# Patient Record
Sex: Female | Born: 1960 | Race: White | Hispanic: No | Marital: Married | Smoking: Never smoker
Health system: Southern US, Community
[De-identification: ages and names within clinical notes are randomized; demographics above are authoritative.]

## PROBLEM LIST (undated history)

## (undated) DIAGNOSIS — E669 Obesity, unspecified: Secondary | ICD-10-CM

## (undated) DIAGNOSIS — J302 Other seasonal allergic rhinitis: Secondary | ICD-10-CM

## (undated) DIAGNOSIS — C801 Malignant (primary) neoplasm, unspecified: Secondary | ICD-10-CM

## (undated) DIAGNOSIS — Z8542 Personal history of malignant neoplasm of other parts of uterus: Secondary | ICD-10-CM

## (undated) DIAGNOSIS — M1990 Unspecified osteoarthritis, unspecified site: Secondary | ICD-10-CM

## (undated) DIAGNOSIS — I10 Essential (primary) hypertension: Secondary | ICD-10-CM

## (undated) HISTORY — DX: Malignant (primary) neoplasm, unspecified: C80.1

## (undated) HISTORY — DX: Other seasonal allergic rhinitis: J30.2

## (undated) HISTORY — DX: Obesity, unspecified: E66.9

## (undated) HISTORY — DX: Personal history of malignant neoplasm of other parts of uterus: Z85.42

## (undated) HISTORY — DX: Essential (primary) hypertension: I10

## (undated) HISTORY — DX: Unspecified osteoarthritis, unspecified site: M19.90

---

## 1997-10-10 ENCOUNTER — Other Ambulatory Visit: Admission: RE | Admit: 1997-10-10 | Discharge: 1997-10-10 | Payer: Self-pay | Admitting: Obstetrics and Gynecology

## 1999-08-18 ENCOUNTER — Other Ambulatory Visit: Admission: RE | Admit: 1999-08-18 | Discharge: 1999-08-18 | Payer: Self-pay | Admitting: Obstetrics and Gynecology

## 2000-09-13 ENCOUNTER — Other Ambulatory Visit: Admission: RE | Admit: 2000-09-13 | Discharge: 2000-09-13 | Payer: Self-pay | Admitting: Obstetrics and Gynecology

## 2002-11-01 ENCOUNTER — Other Ambulatory Visit: Admission: RE | Admit: 2002-11-01 | Discharge: 2002-11-01 | Payer: Self-pay | Admitting: Obstetrics and Gynecology

## 2005-09-20 ENCOUNTER — Emergency Department (HOSPITAL_COMMUNITY): Admission: EM | Admit: 2005-09-20 | Discharge: 2005-09-20 | Payer: Self-pay | Admitting: Emergency Medicine

## 2008-10-11 ENCOUNTER — Encounter (INDEPENDENT_AMBULATORY_CARE_PROVIDER_SITE_OTHER): Payer: Self-pay | Admitting: Obstetrics and Gynecology

## 2008-10-11 ENCOUNTER — Ambulatory Visit (HOSPITAL_COMMUNITY): Admission: RE | Admit: 2008-10-11 | Discharge: 2008-10-11 | Payer: Self-pay | Admitting: Obstetrics and Gynecology

## 2008-11-16 HISTORY — PX: TOTAL ABDOMINAL HYSTERECTOMY: SHX209

## 2010-07-19 HISTORY — PX: TOTAL KNEE ARTHROPLASTY: SHX125

## 2010-10-29 LAB — COMPREHENSIVE METABOLIC PANEL
ALT: 17 U/L (ref 0–35)
BUN: 10 mg/dL (ref 6–23)
CO2: 29 mEq/L (ref 19–32)
Calcium: 8.6 mg/dL (ref 8.4–10.5)
Creatinine, Ser: 0.77 mg/dL (ref 0.4–1.2)
Sodium: 132 mEq/L — ABNORMAL LOW (ref 135–145)
Total Protein: 6.7 g/dL (ref 6.0–8.3)

## 2010-10-29 LAB — CBC
Hemoglobin: 10.3 g/dL — ABNORMAL LOW (ref 12.0–15.0)
Platelets: 410 10*3/uL — ABNORMAL HIGH (ref 150–400)
RBC: 4.03 MIL/uL (ref 3.87–5.11)
RDW: 14.6 % (ref 11.5–15.5)
WBC: 8.3 10*3/uL (ref 4.0–10.5)

## 2010-12-01 NOTE — Op Note (Signed)
NAMEGINGER, LEETH            ACCOUNT NO.:  [id number]   MEDICAL RECORD NO.:  [id number]          PATIENT TYPE:  AMB   LOCATION:  SDC                           FACILITY:  [facility]   PHYSICIAN:  Duke Salvia. Marcelle Overlie, M.D.DATE OF BIRTH:  [birth year]   DATE OF PROCEDURE:  DATE OF DISCHARGE:                               OPERATIVE REPORT   PREOPERATIVE DIAGNOSES:  1. Abnormal uterine bleeding.  2. Endometrial polyp.  3. Possible adenomyosis.   POSTOPERATIVE DIAGNOSES:  1. Abnormal uterine bleeding.  2. Endometrial polyp.  3. Possible adenomyosis.   PROCEDURE:  D and C and hysteroscopy.   SURGEON:  Duke Salvia. Marcelle Overlie, MD   ANESTHESIA:  General.   COMPLICATIONS:  None.   DRAINS:  In-and-out Foley catheter.   BLOOD LOSS:  Minimal.   SPECIMENS REMOVED:  Endometrial curettings.   PROCEDURE AND FINDINGS:  The patient was taken to the operating room.  After an adequate level of general anesthesia was obtained with the  patient's legs in stirrups, the perineum and vagina were prepped and  draped with Betadine.  The bladder was drained.  EUA carried out.  Uterus midposition, normal size.  Adnexa negative.  A rectocele was  noted with first-degree uterine prolapse.  Anterior support was good.   Weighted speculum was positioned.  Cervix grasped with a tenaculum.  Paracervical block created by infiltrating at 3 and 9 o'clock  submucosally 5-7 mL of 1% Xylocaine on either side after negative  aspiration.  The uterus was then sounded to 8 cm, progressively dilated  to a 27 Pratt dilator.  The 5-mm continuous flow hysteroscope was then  used perform hysteroscopy revealing a left fundal polyp and other  generalized endometrial buildup.  Sharp curettage and  exploration with a polyp forceps was then carried out.  Tissue submitted  to Pathology.  The scope was reinserted, and the cavity was irrigated  and noted to be clean.  Both tubal ostia were unremarkable.  No other  tissue or debris  was noted.  She tolerated this well, went to recovery  room in good condition.      Richard M. Marcelle Overlie, M.D.  Electronically Signed     RMH/MEDQ  D:  10/11/2008  T:  10/11/2008  Job:  [id number]

## 2010-12-01 NOTE — H&P (Signed)
NAMEFAREEDAH, Linda Hawkins            ACCOUNT NO.:  [id number]   MEDICAL RECORD NO.:  [id number]          PATIENT TYPE:  AMB   LOCATION:  SDC                           FACILITY:  [facility]   PHYSICIAN:  Duke Salvia. Marcelle Overlie, M.D.DATE OF BIRTH:  Nov 13, 1960   DATE OF ADMISSION:  DATE OF DISCHARGE:                              HISTORY & PHYSICAL   CHIEF COMPLAINT:  Abnormal uterine bleeding.   HISTORY OF PRESENT ILLNESS:  A 50 year old G3, P3, prior tubal, with a 6-  12 months history of heavy often irregular bleeding.  FHT was done in  our office, March 2010 that showed slight thickening of the myometrium,  possible adenomyosis, simple cyst on the left ovary.  Right ovary  negative.  There was a well-defined 1.3-cm polyp in the endometrial  cavity.  She presents now for D and C, hysteroscopy.  This procedure  including risks related to bleeding, infection, adjacent organ injury,  and other complications that may require additional surgery all  reviewed, which she understands and accepts.   PAST MEDICAL HISTORY:   ALLERGIES:  None.   CURRENT MEDICATIONS:  Diovan.   OBSTETRICAL HISTORY:  Three vaginal deliveries.   SOCIAL HISTORY:  Denies alcohol or tobacco use.  She is married.  Her  PCP is Dr. Georgina Pillion.   REVIEW OF SYSTEMS:  Significant for essential hypertension and arthritis  in her knee.   FAMILY HISTORY:  Significant for diabetes, VTE, hypertension, heart  disease, and IBS   PHYSICAL EXAMINATION:  GENERAL:  Temperature 98.2 and blood pressure  130/82.  LUNGS:  Clear.  HEENT:  Unremarkable.  NECK:  Supple without masses.  CARDIOVASCULAR:  Rate and rhythm without murmurs, rubs, or gallops  noted.  BREASTS:  Negative.  ABDOMEN:  Soft, flat, and nontender.  PELVIC:  Vulva, vagina, are cervix normal.  Pap February 2010 normal.  Uterus midposition, normal size.  Adnexa negative.  EXTREMITIES:  Unremarkable.  NEUROLOGIC:  Unremarkable.   IMPRESSION:  Abnormal uterine bleeding,  endometrial polyp.   PLAN:  D and C hysteroscopy.  Procedure and risks reviewed as above up.      Richard M. Marcelle Overlie, M.D.  Electronically Signed     RMH/MEDQ  D:  10/04/2008  T:  10/05/2008  Job:  [id number]

## 2011-02-17 HISTORY — PX: JOINT REPLACEMENT: SHX530

## 2011-03-03 ENCOUNTER — Other Ambulatory Visit: Payer: Self-pay | Admitting: Orthopedic Surgery

## 2011-03-03 ENCOUNTER — Ambulatory Visit (HOSPITAL_COMMUNITY)
Admission: RE | Admit: 2011-03-03 | Discharge: 2011-03-03 | Disposition: A | Discharge status: Home / Self Care | Payer: 59 | Source: Ambulatory Visit | Attending: Orthopedic Surgery | Admitting: Orthopedic Surgery

## 2011-03-03 ENCOUNTER — Encounter (HOSPITAL_COMMUNITY): Payer: 59

## 2011-03-03 ENCOUNTER — Other Ambulatory Visit (HOSPITAL_COMMUNITY): Payer: Self-pay | Admitting: Orthopedic Surgery

## 2011-03-03 DIAGNOSIS — Z79899 Other long term (current) drug therapy: Secondary | ICD-10-CM | POA: Insufficient documentation

## 2011-03-03 DIAGNOSIS — Z01818 Encounter for other preprocedural examination: Secondary | ICD-10-CM

## 2011-03-03 DIAGNOSIS — M1710 Unilateral primary osteoarthritis, unspecified knee: Secondary | ICD-10-CM | POA: Insufficient documentation

## 2011-03-03 DIAGNOSIS — Z01812 Encounter for preprocedural laboratory examination: Secondary | ICD-10-CM | POA: Insufficient documentation

## 2011-03-03 DIAGNOSIS — Z8542 Personal history of malignant neoplasm of other parts of uterus: Secondary | ICD-10-CM | POA: Insufficient documentation

## 2011-03-03 DIAGNOSIS — I10 Essential (primary) hypertension: Secondary | ICD-10-CM | POA: Insufficient documentation

## 2011-03-03 DIAGNOSIS — Z90710 Acquired absence of both cervix and uterus: Secondary | ICD-10-CM | POA: Insufficient documentation

## 2011-03-03 DIAGNOSIS — K219 Gastro-esophageal reflux disease without esophagitis: Secondary | ICD-10-CM | POA: Insufficient documentation

## 2011-03-03 LAB — URINALYSIS, ROUTINE W REFLEX MICROSCOPIC
Bilirubin Urine: NEGATIVE
Hgb urine dipstick: NEGATIVE
Ketones, ur: NEGATIVE mg/dL
Nitrite: NEGATIVE

## 2011-03-03 LAB — SURGICAL PCR SCREEN: Staphylococcus aureus: NEGATIVE

## 2011-03-03 LAB — BASIC METABOLIC PANEL
BUN: 14 mg/dL (ref 6–23)
CO2: 34 mEq/L — ABNORMAL HIGH (ref 19–32)
Calcium: 9.8 mg/dL (ref 8.4–10.5)
Chloride: 101 mEq/L (ref 96–112)
GFR calc Af Amer: >60 mL/min (ref >60)
Glucose, Bld: 77 mg/dL (ref 70–99)
Potassium: 3.9 mEq/L (ref 3.5–5.1)

## 2011-03-03 LAB — DIFFERENTIAL
Eosinophils Absolute: 0.2 10*3/uL (ref 0.0–0.7)
Eosinophils Relative: 2 % (ref 0–5)
Neutrophils Relative %: 75 % (ref 43–77)

## 2011-03-03 LAB — CBC
HCT: 41.6 % (ref 36.0–46.0)
Hemoglobin: 13.4 g/dL (ref 12.0–15.0)
Platelets: 331 10*3/uL (ref 150–400)
RDW: 13.7 % (ref 11.5–15.5)
WBC: 10.3 10*3/uL (ref 4.0–10.5)

## 2011-03-03 LAB — PROTIME-INR: Prothrombin Time: 13.9 seconds (ref 11.6–15.2)

## 2011-03-08 ENCOUNTER — Inpatient Hospital Stay (HOSPITAL_COMMUNITY)
Admission: RE | Admit: 2011-03-08 | Discharge: 2011-03-10 | DRG: 470 | Disposition: A | Discharge status: Home Health | Payer: 59 | Source: Ambulatory Visit | Attending: Orthopedic Surgery | Admitting: Orthopedic Surgery

## 2011-03-08 DIAGNOSIS — Z01812 Encounter for preprocedural laboratory examination: Secondary | ICD-10-CM

## 2011-03-08 DIAGNOSIS — M21069 Valgus deformity, not elsewhere classified, unspecified knee: Secondary | ICD-10-CM | POA: Diagnosis present

## 2011-03-08 DIAGNOSIS — M1710 Unilateral primary osteoarthritis, unspecified knee: Principal | ICD-10-CM | POA: Diagnosis present

## 2011-03-08 DIAGNOSIS — K219 Gastro-esophageal reflux disease without esophagitis: Secondary | ICD-10-CM | POA: Diagnosis present

## 2011-03-08 DIAGNOSIS — Z79899 Other long term (current) drug therapy: Secondary | ICD-10-CM

## 2011-03-08 DIAGNOSIS — I10 Essential (primary) hypertension: Secondary | ICD-10-CM | POA: Diagnosis present

## 2011-03-08 LAB — TYPE AND SCREEN

## 2011-03-09 LAB — BASIC METABOLIC PANEL
CO2: 29 mEq/L (ref 19–32)
Calcium: 8.7 mg/dL (ref 8.4–10.5)
Chloride: 99 mEq/L (ref 96–112)
Creatinine, Ser: 0.63 mg/dL (ref 0.50–1.10)
GFR calc non Af Amer: >60 mL/min (ref >60)
Glucose, Bld: 121 mg/dL — ABNORMAL HIGH (ref 70–99)

## 2011-03-10 LAB — CBC
HCT: 34.6 % — ABNORMAL LOW (ref 36.0–46.0)
Hemoglobin: 11 g/dL — ABNORMAL LOW (ref 12.0–15.0)
MCV: 86.1 fL (ref 78.0–100.0)
RDW: 14 % (ref 11.5–15.5)
WBC: 11.6 10*3/uL — ABNORMAL HIGH (ref 4.0–10.5)

## 2011-03-10 LAB — BASIC METABOLIC PANEL
Chloride: 100 mEq/L (ref 96–112)
GFR calc non Af Amer: >60 mL/min (ref >60)
Potassium: 3.7 mEq/L (ref 3.5–5.1)
Sodium: 137 mEq/L (ref 135–145)

## 2011-03-12 NOTE — Discharge Summary (Signed)
NAMEGILLIAN, Hawkins NO.:  [id number]  MEDICAL RECORD NO.:  [id number]  LOCATION:  1613                         FACILITY:  [facility]  PHYSICIAN:  Madlyn Frankel. Charlann Boxer, M.D.  DATE OF BIRTH:  [birth year]  DATE OF ADMISSION:  03/08/2011 DATE OF DISCHARGE:  03/10/2011                              DISCHARGE SUMMARY   PROCEDURE:  Left total knee arthroplasty.  ADMITTING DIAGNOSIS:  Left knee osteoarthritis.  DISCHARGE DIAGNOSES: 1. Status post left total knee arthroplasty. 2. Hypertension. 3. Reflux.  HISTORY OF PRESENT ILLNESS:  The patient is a 50 year old lady with a history of osteoarthritis of her left knee.  The patient has tried several conservative treatments all of which have failed to control her symptoms.  X-rays in the clinic do reveal osteoarthritis of the left knee.  After discussions of options with the patient, the patient wished to proceed with surgery.  Risks, benefits and expectations of procedure were discussed with the patient.  The patient understands risks, benefits and expectations and wishes to proceed with a left total knee arthroplasty per Dr. Charlann Boxer.  HOSPITAL COURSE:  The patient underwent the above-stated procedure on March 08, 2011.  The patient tolerated the procedure well, was brought to the recovery room in good condition and subsequently to the floor.  Postop day #1, the patient was doing well, no instances, afebrile, vital signs stable.  She is distally neurovascularly intact.  Hemoglobin and hematocrit are 11.8/36.56, otherwise good.  The patient's Hemovac was removed.  Dressing was good, clear, dry and intact.  The patient has physical therapy.  Postop day #2, March 10, 2011, the patient doing well this morning.  On the morning, she is afebrile, vital signs stable.  Hematocrit is 34.6. Left knee is clean, dry and intact.  The patient does have physical therapy.  She is felt to be doing well enough to be discharged  home.  DISCHARGE CONDITION:  Good.  DISCHARGE INSTRUCTIONS:  The patient will be discharged home today, March 10, 2011 with home health.  The patient will be weightbearing as tolerated.  The patient will maintain her surgical dressing for about 8 days after which she will replace with gauze and tape.  The patient will keep the area dry and clean until follow-up.  The patient will follow up with Dr. Charlann Boxer, [facility] in 2 weeks.  DISCHARGE MEDICATIONS: 1. Aspirin enteric-coated 325 mg one p.o. b.i.d. x4 weeks. 2. Benadryl 25 mg one p.o. q.4 h. p.r.n. 3. Colace 100 mg one p.o. b.i.d. p.r.n. constipation. 4. Iron sulfate 325 mg one p.o. t.i.d. times 2 to 3 weeks. 5. Norco 7.5/325 one to two p.o. q.4-6 hours p.r.n. pain. 6. MiraLax 17 g one p.o. q. day p.r.n. constipation. 7. Robaxin 500 mg one p.o. q.6 h. p.r.n. muscle spasms. 8. Diovan HCT 160/25 mg one p.o. q.a.m. 9. Zantac 150 mg one p.o. b.i.d.    ______________________________ Lanney Gins, PA   ______________________________ Madlyn Frankel. Charlann Boxer, M.D.    MB/MEDQ  D:  03/10/2011  T:  03/10/2011  Job:  [id number]  cc:   Ancil Boozer, MD Fax: [phone]  Electronically Signed by Lanney Gins PA on 03/11/2011 12:56:58 PM Electronically Signed by Durene Romans  M.D. on 03/12/2011 07:57:43 AM

## 2011-03-12 NOTE — Op Note (Signed)
NAMEBRISEIDY, SPARK NO.:  [id number]  MEDICAL RECORD NO.:  [id number]  LOCATION:  1613                         FACILITY:  [facility]  PHYSICIAN:  Madlyn Frankel. Charlann Boxer, M.D.  DATE OF BIRTH:  [birth year]  DATE OF PROCEDURE:  03/08/2011 DATE OF DISCHARGE:                              OPERATIVE REPORT   PREOPERATIVE DIAGNOSIS:  Left knee osteoarthritis.  POSTOPERATIVE DIAGNOSIS:  Left knee osteoarthritis.  PROCEDURE:  Left total knee replacement utilizing DePuy component size 2.5 posterior stabilized rotating platform knee with a size 3 tibia, 12.5 insert, and a 35 patellar button.  SURGEON:  Madlyn Frankel. Charlann Boxer, MD  ASSISTANT:  Lanney Gins, PA-C  ANESTHESIA:  General plus a spinal block that may or may not have applied.  SPECIMENS:  None.  COMPLICATIONS:  None.  BLOOD LOSS:  Minimal.  DRAINS:  One Hemovac.  TOURNIQUET TIME:  38 minutes at 250 mmHg.  INDICATIONS OF PROCEDURE:  Linda Hawkins is a pleasant, 50 year old female currently referred for surgical consultation of her left knee. Radiographs revealed advanced left knee arthritis with bone on bone changes, predominant lateral and anterior compartments.  She had failed conservative measures and after reviewing risks of infection, DVT, component failure, need for revision surgery, particularly related to her overall size, and other comorbidities.  Consent was obtained for benefit of pain relief for left total knee replacement.  PROCEDURE IN DETAIL:  The patient was brought to operative theater. Once adequate anesthesia, preoperative antibiotics, Ancef administered, she was positioned supine with left thigh tourniquet placed.  The left lower extremity was then prepped and draped in sterile fashion with left foot placed in [facility] leg holder.  Time-out was performed identifying the patient, planned procedure, and extremity.  A midline incision was made followed by median arthrotomy.  Limited exposure on  the proximal medial tibia was carried out based on the valgus nature of knee.  Following the exposure and debridement, attention was directed to the patella.  Precut measurement was noted to be about 21 mm, I resected down to just 14 mm, used a 35 patellar button to restore height.  Lug holes were drilled and a metal shim placed to protect the patella from retractors and saw blades.  At this point, the attention was directed to femur.  Femoral canal was opened with a drill, irrigated to try to prevent fat emboli.  An intramedullary rod was passed and at 5 degrees of valgus 10 mm of bone resected off the distal femur.  Following this resection, attention was directed to tibia and the tibia was subluxated anteriorly.  By cruciate debridement, a resection of 6 mm based off the proximal lateral tibia was carried out.  I then confirmed the gap would be stable with the medial and lateral collateral ligaments intact with a 10 mm insert.  We also confirmed the cut was perpendicular in the coronal plane using alignment rod.  I sized the femur to be a size 2.5, the size 2.5 rotation block was then pinned into position, anterior reference using a C-clamp off the proximal tibial cut.  A 4:1 cutting block was then pinned into position.  Anterior and posterior chamfer cuts made without difficulty nor notching.  Final box cut was made off the lateral aspect of distal femur.  At this point, the tibia was subluxated anteriorly, this tibial size was determined to be a size 3 tibial tray.  It was pinned into position to the medial third of tubercle, drilled, and keel punched.  Trial reduction was carried out 2.5 femur, 3 tibia, and a 12.5 insert was chosen initially.  The knee came to full extension with stable from extension and flexion.  There was a little bit of a lateral pull on the patella, however, I felt the rotation, size, and position of components was adequate.  I felt this was more soft  tissue base.  At this point, the trial components were removed from the knee.  The final components were opened and cement mixed.  The synovial capsule junction of the knee was injected with 0.25% Marcaine with epinephrine and 1 cc of Toradol, total 61 cc.  The knee was then irrigated with normal saline solution.  Final components were cemented onto clean and dried cut surfaces of bone.  The knee was brought to extension with a 12.5 insert and extruded cement was removed. Once the cement had fully cured and excessive cement was removed throughout the knee, the final insert was chosen.  The knee was re-irrigated, the tourniquet had been let down after 37 minutes without significant hemostasis required.  Medium Hemovac drain was placed deep.  The extensor mechanism was then reapproximated with the knee in flexion using #1 Vicryl.  The remainder of the wound was closed with 2-0 Vicryl and running 4-0 Monocryl.  The knee was cleaned, dried, and dressed sterilely using Dermabond and Aquacel dressing. Drain site dressed separately.  She was then brought to recovery room in stable condition tolerating procedure well.     Madlyn Frankel Charlann Boxer, M.D.     MDO/MEDQ  D:  03/08/2011  T:  03/09/2011  Job:  [id number]  Electronically Signed by Durene Romans M.D. on 03/12/2011 07:57:39 AM

## 2011-03-15 NOTE — H&P (Addendum)
  Linda Hawkins, Linda Hawkins            ACCOUNT NO.:  [id number]  MEDICAL RECORD NO.:  [id number]  LOCATION:  PADM                         FACILITY:  [facility]  PHYSICIAN:  Madlyn Frankel. Charlann Boxer, M.D.  DATE OF BIRTH:  August 22, 1960  DATE OF ADMISSION:  03/03/2011 DATE OF DISCHARGE:                             HISTORY & PHYSICAL   DATE OF SURGERY:  March 08, 2011  ADMISSION DIAGNOSIS:  Left knee osteoarthritis.  HISTORY OF PRESENT ILLNESS:  This is a 50 year old lady with history of osteoarthritis of the left knee with failure of conserve treatment to alleviate her symptoms.  After discussion of treatment, benefits, risks and options, the patient now scheduled for total knee arthroplasty of left knee.  Note that she is a candidate for tranexamic acid and will receive that at the time of surgery.  She is given her home medicines of aspirin, iron, Robaxin, MiraLax and Colace.  Her medical doctor is Dr. Ancil Boozer at [place].  She will be going home after surgery.  PAST MEDICAL HISTORY:  Drug allergies to LISINOPRIL with cough.  CURRENT MEDICATIONS:  Diovan and Zantac.  MEDICAL ILLNESSES:  Hypertension and reflux and previous surgeries, hysterectomy.  Also history of uterine cancer.  For the hysterectomy, the patient did not need chemo or radiation and has had no problems with it since.  FAMILY HISTORY:  Positive for lung disease, hypertension, and cancer.  SOCIAL HISTORY:  The patient is married.  She is a Scientist, water quality.  She does not smoke and does not drink.  She will be going home after surgery.  REVIEW OF SYSTEMS:  CENTRAL NERVOUS SYSTEM:  Negative for blurred vision or dizziness.  PULMONARY:  Negative for shortness breath, PND, or orthopnea.  CARDIOVASCULAR:  Negative for chest pain, palpitation.  GI: Negative for ulcers, hepatitis.  GU:  Negative for urinary tract difficulty.  MUSCULOSKELETAL:  Positive in HPI.  PHYSICAL EXAMINATION:  VITAL SIGNS:  Blood pressure 128/84,  respirations 16, pulse 78 and regular. GENERAL APPEARANCE:  She is a well-developed, well-nourished lady in no acute distress. HEENT: Head normocephalic.  Nose patent.  Ears patent.  Pupils equal, round, reactive to light.  Throat without injection. NECK:  Supple without adenopathy.  Carotids 2+ without bruit. CHEST:  Clear to auscultation.  No rales or rhonchi.  Respirations 16. HEART:  Regular rate and rhythm at 78 beats per minute without murmur. ABDOMEN:  Soft with active bowel sounds.  No masses or organomegaly. NEUROLOGIC:  The patient is alert and oriented to time, place, person. Cranial nerves II-XII grossly intact. EXTREMITIES:  Show the left knee with a valgus deformity, 3 degree reflex traction, further flexion to 125 degrees.  Neurovascular status intact.  IMPRESSION:  Left knee osteoarthritis  plans left total knee arthroplasty     Jaquelyn Bitter. Chabon, P.A.   ______________________________ Madlyn Frankel Charlann Boxer, M.D.    SJC/MEDQ  D:  03/03/2011  T:  03/04/2011  Job:  [id number]  Electronically Signed by Jodene Nam P.A. on 03/14/2011 08:48:29 AM Electronically Signed by Durene Romans M.D. on 03/15/2011 07:42:53 AM

## 2011-05-26 ENCOUNTER — Other Ambulatory Visit: Payer: Self-pay | Admitting: Family Medicine

## 2011-05-26 DIAGNOSIS — E079 Disorder of thyroid, unspecified: Secondary | ICD-10-CM

## 2011-06-15 ENCOUNTER — Ambulatory Visit
Admission: RE | Admit: 2011-06-15 | Discharge: 2011-06-15 | Disposition: A | Discharge status: Home / Self Care | Payer: 59 | Source: Ambulatory Visit | Attending: Family Medicine | Admitting: Family Medicine

## 2011-06-15 DIAGNOSIS — E079 Disorder of thyroid, unspecified: Secondary | ICD-10-CM

## 2011-06-19 HISTORY — PX: BIOPSY THYROID: PRO38

## 2011-06-30 ENCOUNTER — Encounter (INDEPENDENT_AMBULATORY_CARE_PROVIDER_SITE_OTHER): Payer: Self-pay | Admitting: Surgery

## 2011-07-02 ENCOUNTER — Ambulatory Visit (INDEPENDENT_AMBULATORY_CARE_PROVIDER_SITE_OTHER): Payer: 59 | Admitting: Surgery

## 2011-07-02 ENCOUNTER — Encounter (INDEPENDENT_AMBULATORY_CARE_PROVIDER_SITE_OTHER): Payer: Self-pay | Admitting: Surgery

## 2011-07-02 VITALS — BP 136/98 | HR 64 | Temp 97.4°F | Resp 18 | Ht 63.0 in | Wt 271.1 lb

## 2011-07-02 DIAGNOSIS — E079 Disorder of thyroid, unspecified: Secondary | ICD-10-CM

## 2011-07-02 NOTE — Patient Instructions (Signed)
We will arrange a biopsy of the two areas in your thyroid gland. It is obese are benign and appeared to be consistent with a goiter he may not need to have surgery. We will discuss this after the results of the biopsy are available.

## 2011-07-02 NOTE — Progress Notes (Signed)
Referred by Dr Ancil Boozer, MD  CC: Multinodular goiter HPI: On a recent routine physical examination this patient was noticed to have an enlarged thyroid gland. She is having no symptoms from it such as compression was change Melvern Banker she has had some fatigue. She was not aware of a mass in her neck and has had no other neck problems in the past.   ROS: Her 12 point review of systems filled out on our office for his negative  MEDS: Current Outpatient Prescriptions  Medication Sig Dispense Refill  . ranitidine (ZANTAC) 150 MG capsule Take 150 mg by mouth 2 (two) times daily.        . valsartan-hydrochlorothiazide (DIOVAN-HCT) 160-25 MG per tablet Take 1 tablet by mouth daily.            ALLERGIES: Allergies  Allergen Reactions  . Lisinopril Cough      PE Gen.: The patient is alert oriented and healthy-appearing Neck: Thyroid gland is visible. It is diffusely enlarged and nodular. There are no hard masses. It is nontender. The left side seems larger than the right. There is no associated cervical adenopathy.  Data Reviewed I have reviewed the notes from her primary physician. Thyroid function tests were normal. I also reviewed the ultrasound report which shows multiple solid and complex cystic masses within both lobes of the thyroid gland. Two of these were somewhat suspicious looking. No adenopathy was noted.  Assessment Multinodular goiter with two nodules were somewhat suspicious  Plan I think the next step should be a fine-needle aspiration biopsy of both nodules in the thyroid gland that were suspicious. If these are benign and consistent with multinodular goiter she may well be able to be managed medically with thyroid suppression. Since she is not having any current symptoms I think this would be a reasonable alternative to surgery. I did explain thyroid surgery to her including the fact that she would likely need a total thyroidectomy in have risks for recurrent  nerve injury and hypocalcemia.  CC; Dr Sandi Mealy

## 2011-07-28 ENCOUNTER — Ambulatory Visit
Admission: RE | Admit: 2011-07-28 | Discharge: 2011-07-28 | Disposition: A | Discharge status: Home / Self Care | Payer: 59 | Source: Ambulatory Visit | Attending: Surgery | Admitting: Surgery

## 2011-07-28 ENCOUNTER — Other Ambulatory Visit (HOSPITAL_COMMUNITY)
Admission: RE | Admit: 2011-07-28 | Discharge: 2011-07-28 | Disposition: A | Discharge status: Home / Self Care | Payer: 59 | Source: Ambulatory Visit | Attending: Interventional Radiology | Admitting: Interventional Radiology

## 2011-07-28 DIAGNOSIS — E079 Disorder of thyroid, unspecified: Secondary | ICD-10-CM

## 2011-07-28 DIAGNOSIS — E049 Nontoxic goiter, unspecified: Secondary | ICD-10-CM | POA: Insufficient documentation

## 2011-08-04 ENCOUNTER — Encounter (INDEPENDENT_AMBULATORY_CARE_PROVIDER_SITE_OTHER): Payer: Self-pay

## 2011-08-06 ENCOUNTER — Telehealth (INDEPENDENT_AMBULATORY_CARE_PROVIDER_SITE_OTHER): Payer: Self-pay

## 2011-08-06 NOTE — Telephone Encounter (Signed)
Returned pt's v.m. Explaining that Lesly Rubenstein was not in the office today but I would give this message to the so she can call with thyroid results on Monday.

## 2011-08-09 NOTE — Telephone Encounter (Signed)
Made her aware her thyroid results look benign. Advised for her to make an appt for follow up with Dr Jamey Ripa to discuss results further and discuss a plan. Patient agrees with this and made appt for 08/20/11 with Dr Jamey Ripa.

## 2011-08-20 ENCOUNTER — Encounter (INDEPENDENT_AMBULATORY_CARE_PROVIDER_SITE_OTHER): Payer: 59 | Admitting: Surgery

## 2011-09-09 ENCOUNTER — Ambulatory Visit (INDEPENDENT_AMBULATORY_CARE_PROVIDER_SITE_OTHER): Payer: 59 | Admitting: Surgery

## 2011-09-09 ENCOUNTER — Encounter (INDEPENDENT_AMBULATORY_CARE_PROVIDER_SITE_OTHER): Payer: Self-pay | Admitting: Surgery

## 2011-09-09 VITALS — BP 142/94 | HR 84 | Temp 98.2°F | Resp 16 | Ht 64.0 in | Wt 275.8 lb

## 2011-09-09 DIAGNOSIS — E042 Nontoxic multinodular goiter: Secondary | ICD-10-CM

## 2011-09-09 NOTE — Patient Instructions (Signed)
We will need to see you again in six months and repeat your thyroid ultrasound and blood test her thyroid functions prior to your visit with me. If he developed any symptoms from your thyroid such as compression, problem swallowing et Karie Soda give me a call and we will see you sooner

## 2011-09-09 NOTE — Progress Notes (Signed)
  CC: Followup thyroid nodule  HPI: This patient comes in for post biopsy follow-up. She underwent bilateral thyroid nodule biopsies on July 28, 2011. She feels that she is doing well. She did not have any significant change in her thyroid symptoms which are basically minimal with only some compression symptoms noted right after the biopsy. PE: General: The patient appears to be healthy, NAD Her thyroid gland continued to be marked enlarged with about a 4 cm nodule in the left lobe and a 2-1/2 cm nodule in the right lobe  DATA REVIEWED: Biopsy of both lobes showed findings consistent with a benign follicular nodule with some cystic degeneration on the one on the left side.   IMPRESSION: The patient is doing well S/P thyroid biopsy. However alternatives with her which would include thyroidectomy and we discussed the risks and complications of that surgery. In addition we talked about just doing a six-month follow up with an ultrasound and thyroid function to that point in time. Since there were no suspicious findings on ultrasound and this looks more consistent with a multinodular goiter in terms of the exam and ultrasound exam and she is asymptomatic I think a six-month followup is reasonable alternative.    PLAN: I will see her back in six months as noted above. One option for her might be to go on some thyroid suppression with Synthroid. I told her she could discuss that with her primary physician but otherwise we will just plan to see her back for follow up here

## 2012-01-18 ENCOUNTER — Ambulatory Visit
Admission: RE | Admit: 2012-01-18 | Discharge: 2012-01-18 | Disposition: A | Discharge status: Home / Self Care | Payer: 59 | Source: Ambulatory Visit | Attending: Surgery | Admitting: Surgery

## 2012-01-18 ENCOUNTER — Telehealth (INDEPENDENT_AMBULATORY_CARE_PROVIDER_SITE_OTHER): Payer: Self-pay | Admitting: General Surgery

## 2012-01-18 DIAGNOSIS — E042 Nontoxic multinodular goiter: Secondary | ICD-10-CM

## 2012-01-18 NOTE — Telephone Encounter (Signed)
Patient made aware of ultrasound results. Will follow up at appt and call with any questions prior.

## 2012-01-18 NOTE — Telephone Encounter (Signed)
Message copied by Liliana Cline on Tue Jan 18, 2012 12:05 PM ------      Message from: Currie Paris      Created: Tue Jan 18, 2012 11:53 AM       Let her know that the ultrasound shows stable nodules, no worrisome change

## 2012-03-24 ENCOUNTER — Encounter (INDEPENDENT_AMBULATORY_CARE_PROVIDER_SITE_OTHER): Payer: 59 | Admitting: Surgery

## 2013-01-14 IMAGING — US US SOFT TISSUE HEAD/NECK
1 series · 14 of 25 positions shown · non-contrast
Comparison: None.

CLINICAL DATA: Thyroidmegaly

THYROID ULTRASOUND
TECHNIQUE: Ultrasound examination of the thyroid gland and adjacent
soft tissues was performed.

[Series 1: us soft tissue head/neck · 0.13mm/px · 14 of 78 slices shown]
[im 1/78]
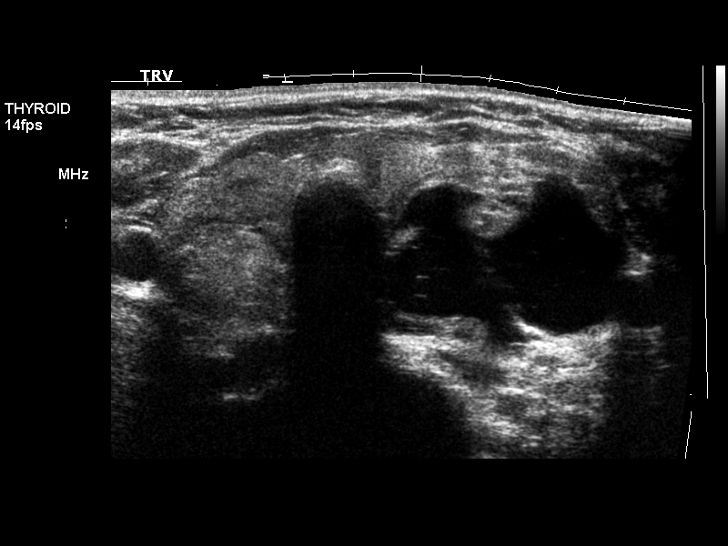
[im 7/78]
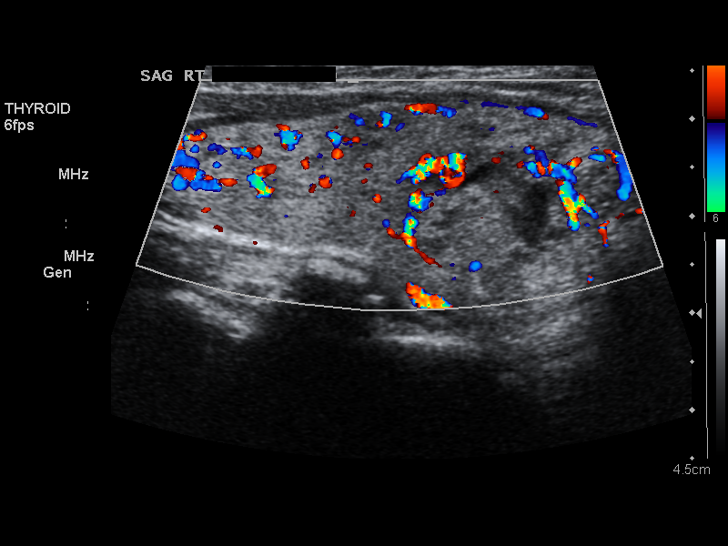
[im 13/78]
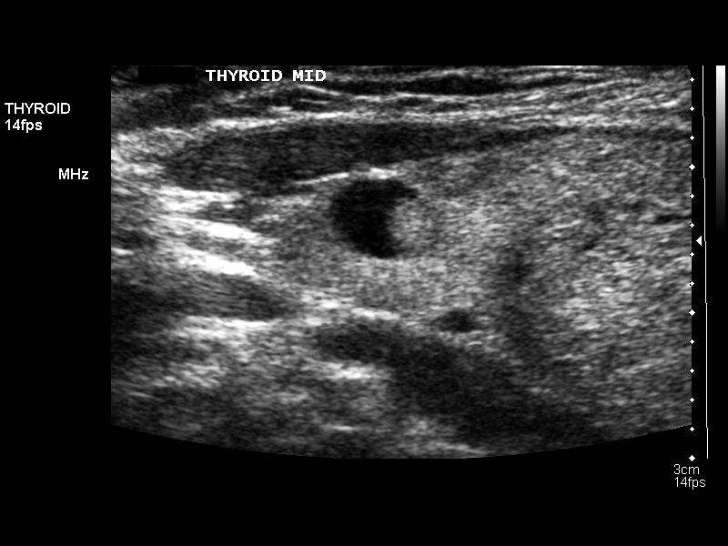
[im 20/78]
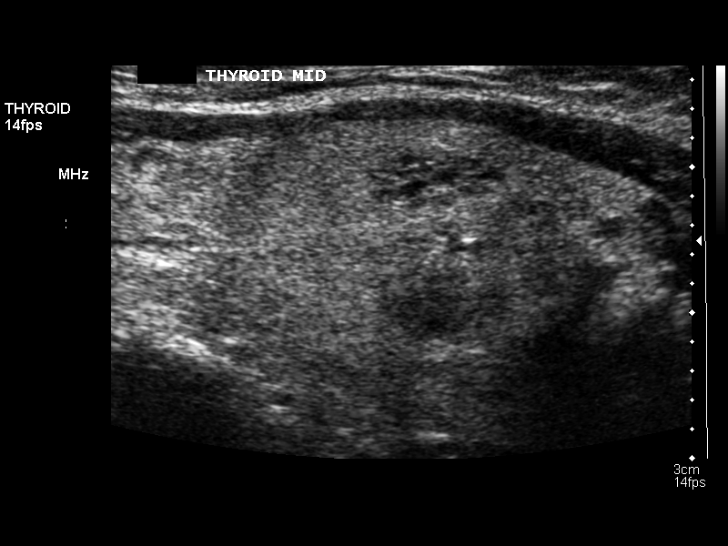
[im 26/78]
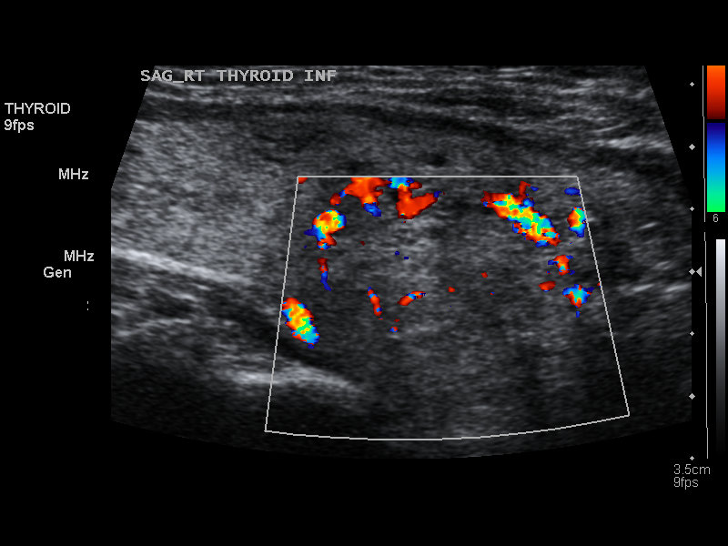
[im 29/78]
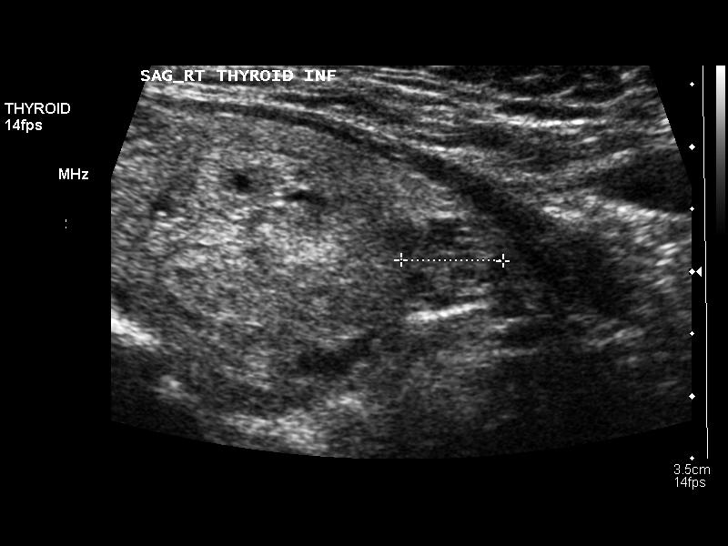
[im 36/78]
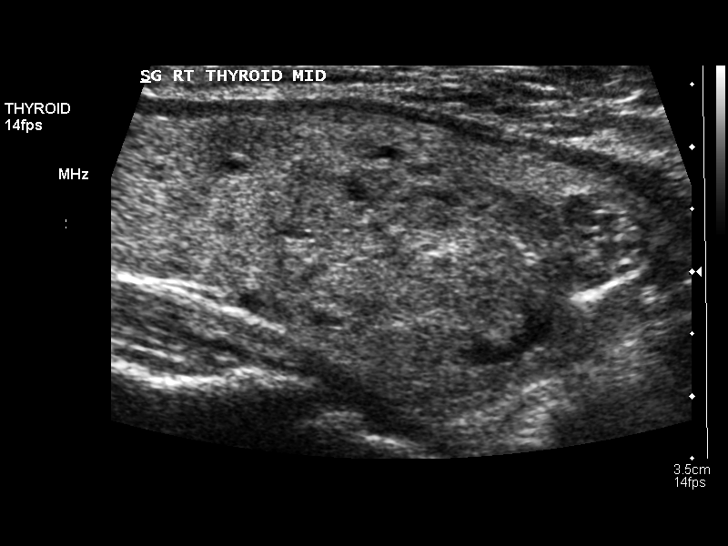
[im 42/78]
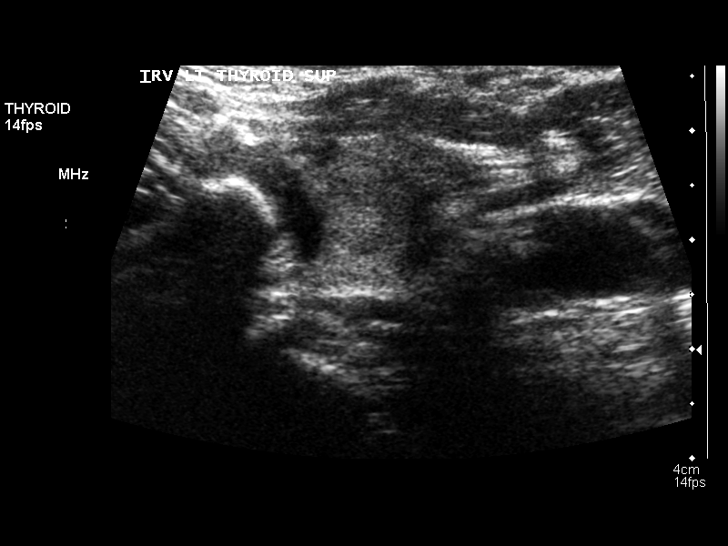
[im 49/78]
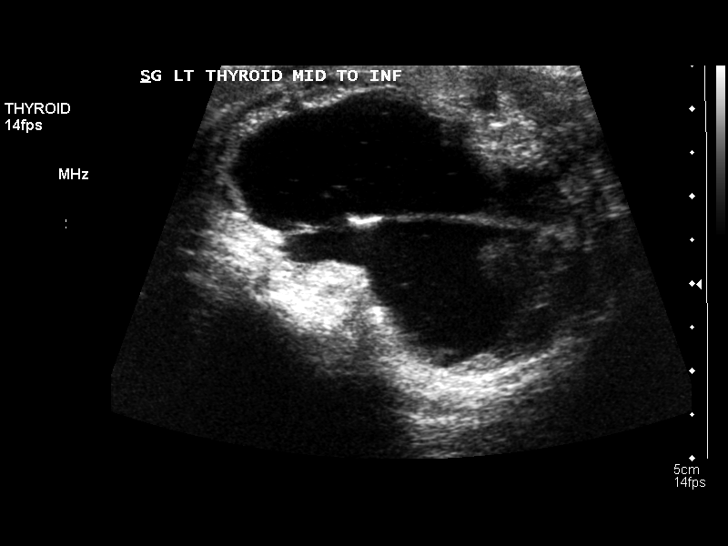
[im 52/78]
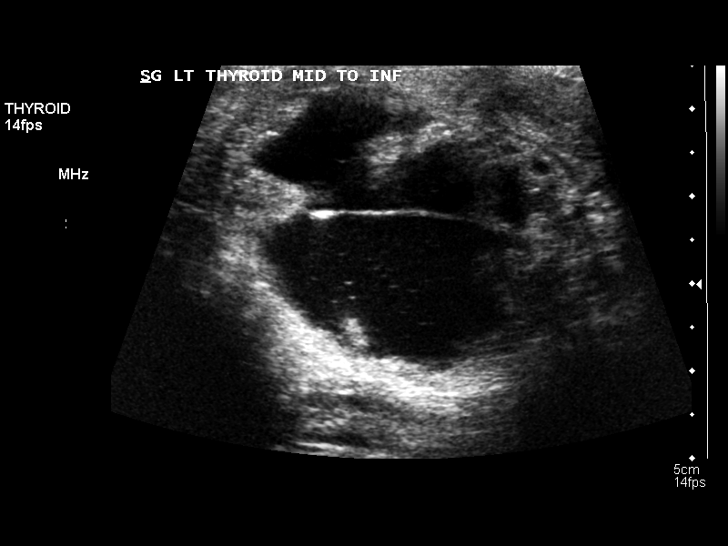
[im 58/78]
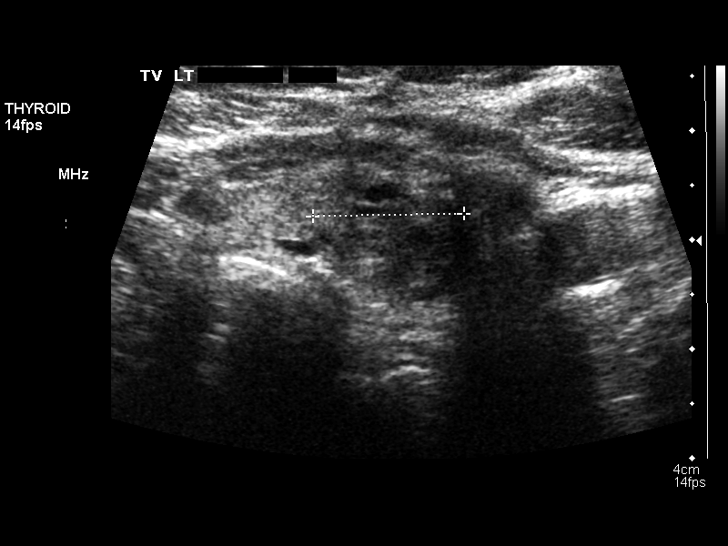
[im 65/78]
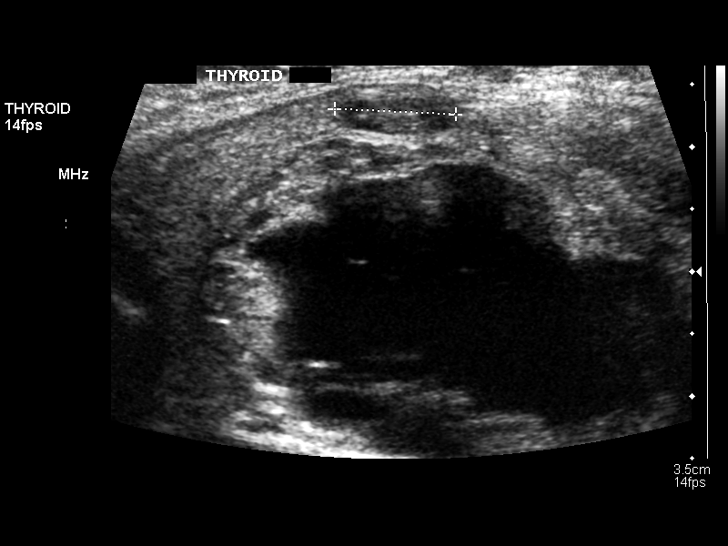
[im 71/78]
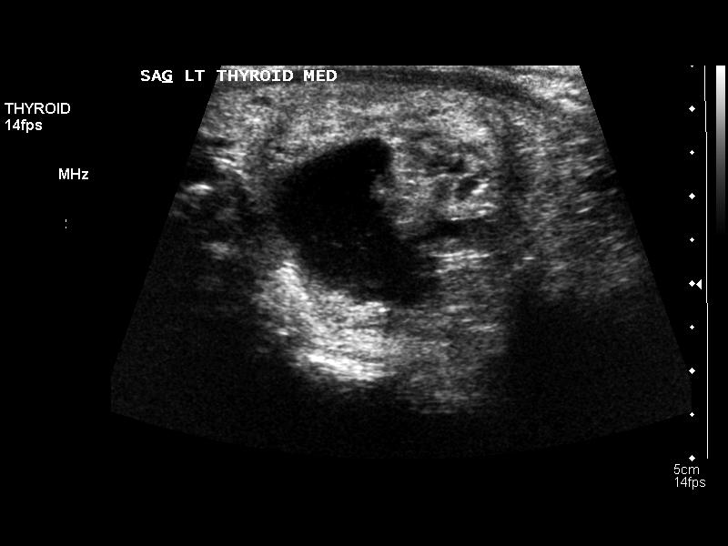
[im 78/78]
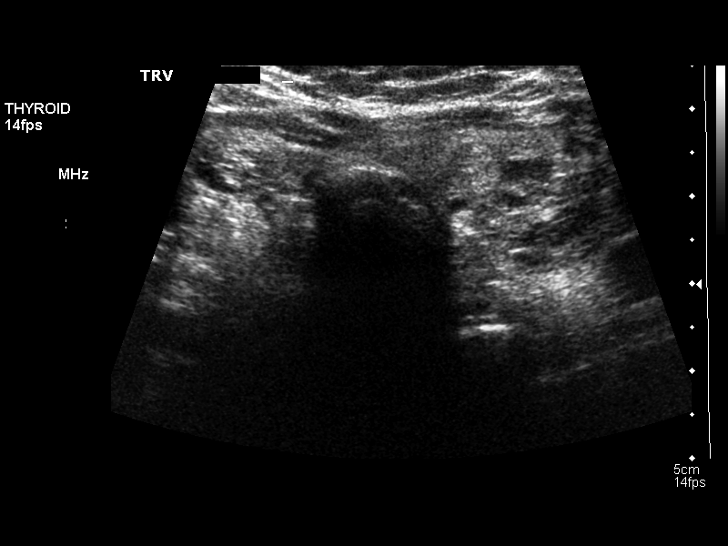

[14 of 25 positions shown; findings below may reference images not displayed]

FINDINGS: Both lobes of the thyroid gland are enlarged and
heterogeneous with the right lobe measuring 5.6 x 2.7 x 2.2 cm, and
the left lobe measuring 7.4 x 3.7 x 3.8 cm.  The isthmus is also
thickened, measuring 6 mm in AP width.

There are multiple solid and complex cystic masses within both
lobes of the thyroid gland.  The largest are a 2.4 cm irregular
solid mass in the inferior pole of the right lobe and a 4.7 cm
complex cystic mass with irregular mural nodularity in the mid -
inferior pole of the left lobe.  There is an additional 1.4 cm
solid mass at the inferior pole of the left lobe.  The remainder of
the lesions bilaterally are less than 1 cm.

There is no adenopathy.
IMPRESSION: Findings are consistent with a multinodular goiter.

There are suspicious dominant lesions within both lobes for which
biopsy should be considered.  These are located in the inferior
pole of the right lobe and the mid - inferior pole of the left
lobe, as described above.

## 2019-02-06 ENCOUNTER — Other Ambulatory Visit: Payer: Self-pay

## 2019-02-06 DIAGNOSIS — Z20822 Contact with and (suspected) exposure to covid-19: Secondary | ICD-10-CM

## 2019-02-09 LAB — NOVEL CORONAVIRUS, NAA: SARS-CoV-2, NAA: NOT DETECTED

## 2019-10-13 ENCOUNTER — Ambulatory Visit: Payer: Self-pay | Attending: Internal Medicine

## 2019-10-13 DIAGNOSIS — Z23 Encounter for immunization: Secondary | ICD-10-CM

## 2019-10-13 NOTE — Progress Notes (Signed)
   Covid-19 Vaccination Clinic  Name:  Linda Hawkins    MRN: [id number] DOB: [birth year]  10/13/2019  [name] was observed post Covid-19 immunization for 15 minutes without incident. She was provided with Vaccine Information Sheet and instruction to access the V-Safe system.   [name] was instructed to call 911 with any severe reactions post vaccine: Marland Kitchen Difficulty breathing  . Swelling of face and throat  . A fast heartbeat  . A bad rash all over body  . Dizziness and weakness   Immunizations Administered    Name Date Dose VIS Date Route   Pfizer COVID-19 Vaccine 10/13/2019  3:10 PM 0.3 mL 06/29/2019 Intramuscular   Manufacturer: [place]   Lot: [id number]   [place]: [id number]

## 2019-11-07 ENCOUNTER — Ambulatory Visit: Payer: Self-pay | Attending: Internal Medicine

## 2019-11-07 DIAGNOSIS — Z23 Encounter for immunization: Secondary | ICD-10-CM

## 2019-11-07 NOTE — Progress Notes (Signed)
   Covid-19 Vaccination Clinic  Name:  Linda Hawkins    MRN: [id number] DOB: 29-Jan-1961  11/07/2019  Linda Hawkins was observed post Covid-19 immunization for 15 minutes without incident. She was provided with Vaccine Information Sheet and instruction to access the V-Safe system.   Linda Hawkins was instructed to call 911 with any severe reactions post vaccine: Marland Kitchen Difficulty breathing  . Swelling of face and throat  . A fast heartbeat  . A bad rash all over body  . Dizziness and weakness   Immunizations Administered    Name Date Dose VIS Date Route   Pfizer COVID-19 Vaccine 11/07/2019  4:01 PM 0.3 mL 09/12/2018 Intramuscular   Manufacturer: [place]   Lot: [id number]   [place]: [id number]

## 2020-01-28 ENCOUNTER — Other Ambulatory Visit: Payer: Self-pay | Admitting: Family Medicine

## 2020-01-28 DIAGNOSIS — E010 Iodine-deficiency related diffuse (endemic) goiter: Secondary | ICD-10-CM

## 2020-02-07 ENCOUNTER — Ambulatory Visit
Admission: RE | Admit: 2020-02-07 | Discharge: 2020-02-07 | Disposition: A | Discharge status: Home / Self Care | Payer: BC Managed Care – PPO | Source: Ambulatory Visit | Attending: Family Medicine | Admitting: Family Medicine

## 2020-02-07 ENCOUNTER — Other Ambulatory Visit: Payer: Self-pay

## 2020-02-07 DIAGNOSIS — E010 Iodine-deficiency related diffuse (endemic) goiter: Secondary | ICD-10-CM

## 2020-02-11 ENCOUNTER — Other Ambulatory Visit: Payer: Self-pay | Admitting: Family Medicine

## 2020-02-11 DIAGNOSIS — E041 Nontoxic single thyroid nodule: Secondary | ICD-10-CM

## 2020-02-18 ENCOUNTER — Other Ambulatory Visit: Payer: Self-pay | Admitting: Family Medicine

## 2020-02-18 DIAGNOSIS — E041 Nontoxic single thyroid nodule: Secondary | ICD-10-CM

## 2020-02-21 ENCOUNTER — Other Ambulatory Visit (HOSPITAL_COMMUNITY)
Admission: RE | Admit: 2020-02-21 | Discharge: 2020-02-21 | Disposition: A | Discharge status: Home / Self Care | Payer: BC Managed Care – PPO | Source: Ambulatory Visit | Attending: Family Medicine | Admitting: Family Medicine

## 2020-02-21 ENCOUNTER — Ambulatory Visit
Admission: RE | Admit: 2020-02-21 | Discharge: 2020-02-21 | Disposition: A | Discharge status: Home / Self Care | Payer: BC Managed Care – PPO | Source: Ambulatory Visit | Attending: Family Medicine | Admitting: Family Medicine

## 2020-02-21 DIAGNOSIS — E041 Nontoxic single thyroid nodule: Secondary | ICD-10-CM | POA: Diagnosis present

## 2020-02-21 NOTE — Procedures (Signed)
PROCEDURE SUMMARY:  Using direct ultrasound guidance, 5 passes were made into the nodule within the right mid lobe of the thyroid, 5 passes were made into the nodule within the left mid lobe of the thyroid, and  5 passes were made  into the nodule within the left inferior lobe of the thyroid using 25 g needles.  Ultrasound was used to confirm needle placements on all occasions.   EBL = trace  Specimens were sent to Pathology for analysis.  See procedure note under Imaging tab in Epic for full procedure details.  Darlena Koval S Chukwudi Ewen PA-C 02/21/2020 3:12 PM

## 2020-02-22 LAB — CYTOLOGY - NON PAP

## 2020-06-25 ENCOUNTER — Telehealth: Payer: BC Managed Care – PPO | Admitting: Emergency Medicine

## 2020-06-25 DIAGNOSIS — J329 Chronic sinusitis, unspecified: Secondary | ICD-10-CM | POA: Diagnosis not present

## 2020-06-25 MED ORDER — AMOXICILLIN-POT CLAVULANATE 875-125 MG PO TABS
1.0000 | ORAL_TABLET | Freq: Two times a day (BID) | ORAL | 0 refills | Status: DC
Start: 2020-06-25 — End: 2021-02-26

## 2020-06-25 NOTE — Progress Notes (Signed)

## 2020-10-10 ENCOUNTER — Telehealth: Payer: BC Managed Care – PPO | Admitting: Nurse Practitioner

## 2020-10-10 DIAGNOSIS — R399 Unspecified symptoms and signs involving the genitourinary system: Secondary | ICD-10-CM

## 2020-10-10 DIAGNOSIS — M545 Low back pain, unspecified: Secondary | ICD-10-CM

## 2020-10-10 NOTE — Progress Notes (Signed)
Based on what you shared with me it looks like you have uti symptoms with back pain,that should be evaluated in a face to face office visit. Due to the associating back pain you will need a urinalysis and urine culture for proper treatment.    NOTE: If you entered your credit card information for this eVisit, you will not be charged. You may see a "hold" on your card for the $35 but that hold will drop off and you will not have a charge processed.  If you are having a true medical emergency please call 911.     For an urgent face to face visit, [facility] has four urgent care centers for your convenience:   . [facility]    [phone]                  Get Driving Directions  [address] [place], [place] [address] . 10 am to 8 pm Monday-Friday . 12 pm to 8 pm Saturday-Sunday   . [facility]  [phone]                  Get Driving Directions  [address], [address] [place], [place] [address] . 8 am to 8 pm Monday-Friday . 9 am to 6 pm Saturday . 11 am to 6 pm Sunday   . [facility] [place]  [phone]                  Get Driving Directions   [address] [place], [place] [address] . 8 am to 8 pm Monday-Friday . 8 am to 4 pm Saturday-Sunday    . [facility]                    Get Driving Directions  [phone]  [address] [place], [place] [address]  . Monday-Friday, 12 PM to 6 PM    Your e-visit answers were reviewed by a board certified advanced clinical practitioner to complete your personal care plan.  Thank you for using e-Visits.  

## 2021-02-26 ENCOUNTER — Telehealth: Payer: BC Managed Care – PPO | Admitting: Emergency Medicine

## 2021-02-26 DIAGNOSIS — J329 Chronic sinusitis, unspecified: Secondary | ICD-10-CM | POA: Diagnosis not present

## 2021-02-26 MED ORDER — AMOXICILLIN-POT CLAVULANATE 875-125 MG PO TABS
1.0000 | ORAL_TABLET | Freq: Two times a day (BID) | ORAL | 0 refills | Status: DC
Start: 2021-02-26 — End: 2024-05-10

## 2021-02-26 NOTE — Progress Notes (Signed)

## 2021-03-10 ENCOUNTER — Other Ambulatory Visit: Payer: Self-pay | Admitting: Family Medicine

## 2021-03-10 DIAGNOSIS — Z1231 Encounter for screening mammogram for malignant neoplasm of breast: Secondary | ICD-10-CM

## 2021-05-14 ENCOUNTER — Ambulatory Visit
Admission: RE | Admit: 2021-05-14 | Discharge: 2021-05-14 | Disposition: A | Discharge status: Home / Self Care | Payer: BC Managed Care – PPO | Source: Ambulatory Visit | Attending: Family Medicine | Admitting: Family Medicine

## 2021-05-14 ENCOUNTER — Other Ambulatory Visit: Payer: Self-pay

## 2021-05-14 DIAGNOSIS — Z1231 Encounter for screening mammogram for malignant neoplasm of breast: Secondary | ICD-10-CM

## 2021-09-08 IMAGING — US US THYROID
1 series · 16 of 25 positions shown · non-contrast
Comparison: 01/18/2012

CLINICAL DATA: 58-year-old female with a history of thyroid
enlargement

EXAM:
THYROID ULTRASOUND
TECHNIQUE: Ultrasound examination of the thyroid gland and adjacent soft
tissues was performed.

[Series 1: us thyroid · 0.04mm/px · 16 of 78 slices shown]
[im 1/78]
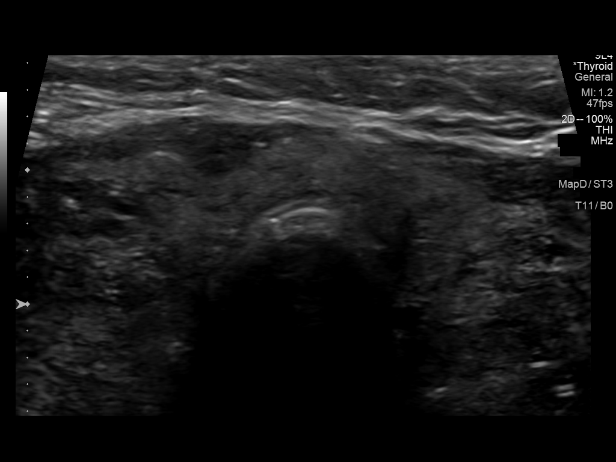
[im 7/78]
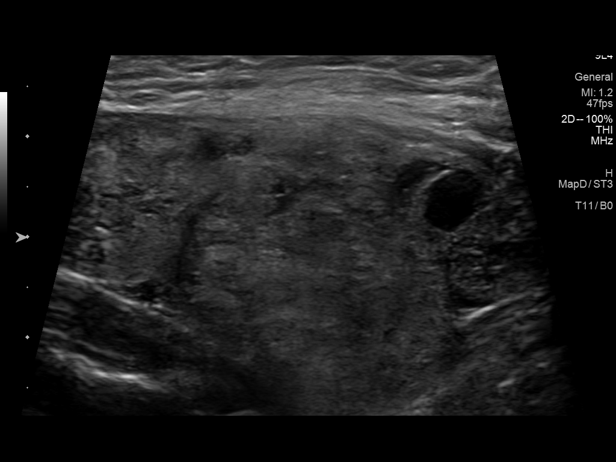
[im 10/78]
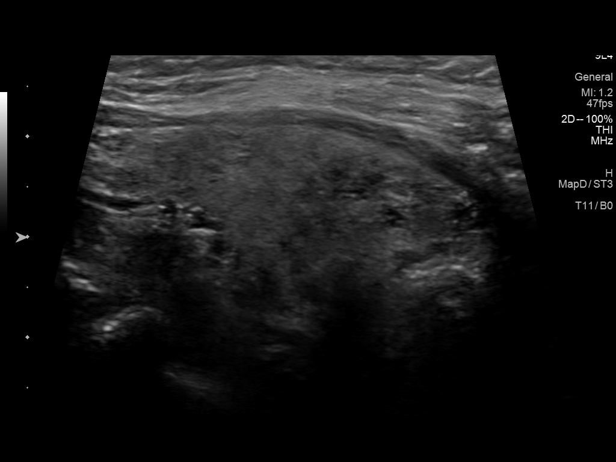
[im 17/78]
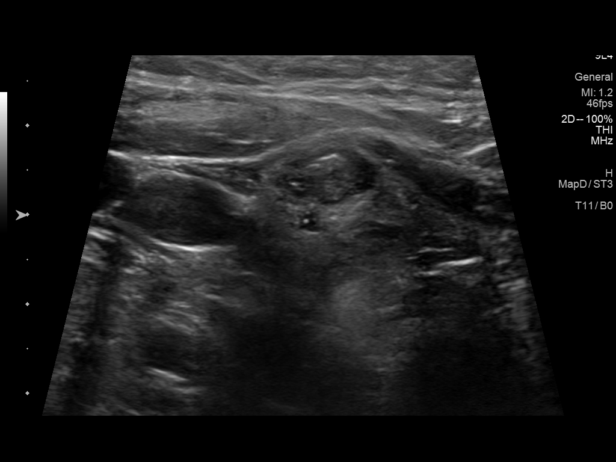
[im 23/78]
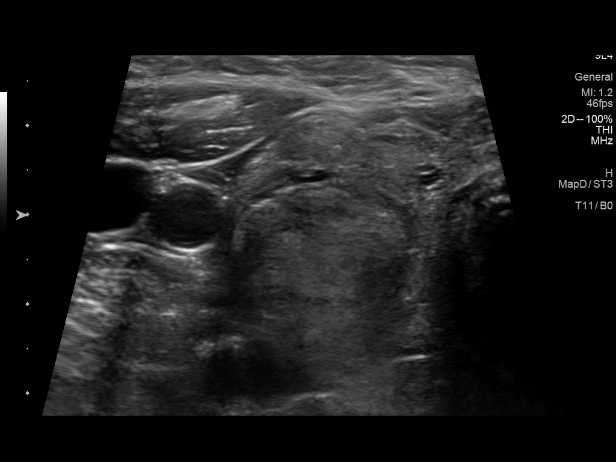
[im 26/78]
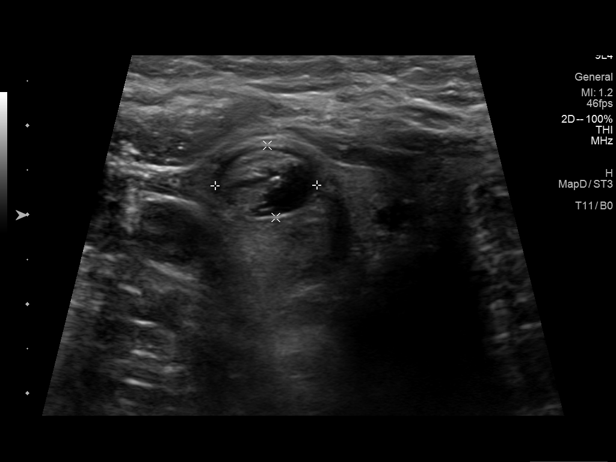
[im 33/78]
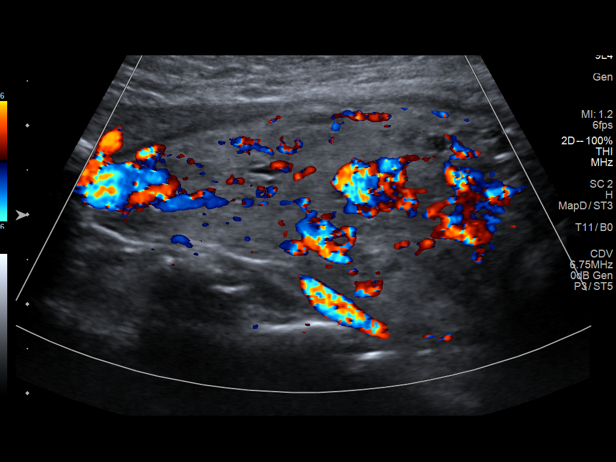
[im 36/78]
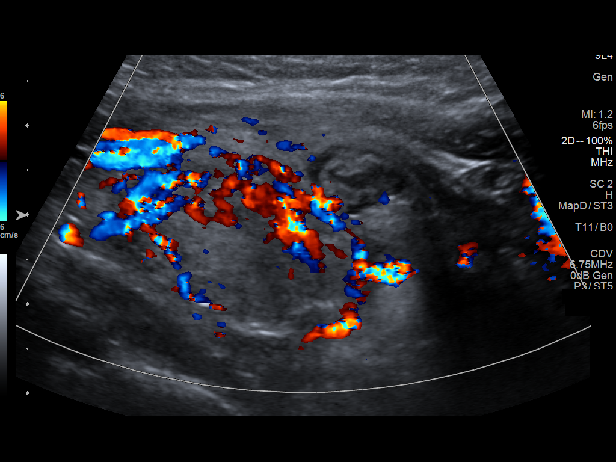
[im 42/78]
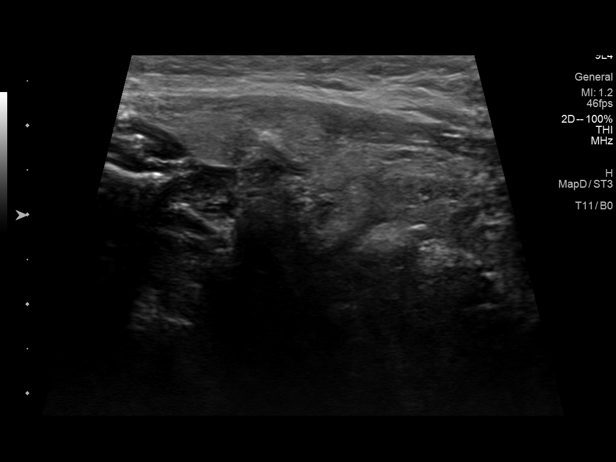
[im 45/78]
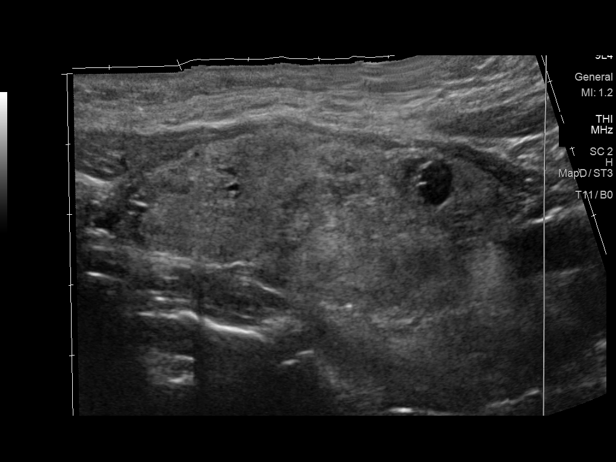
[im 52/78]
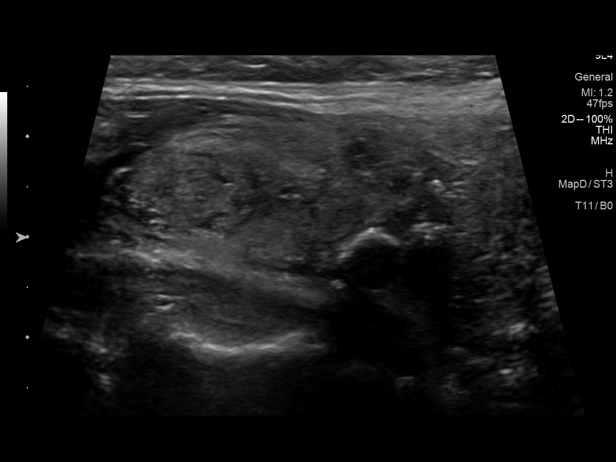
[im 55/78]
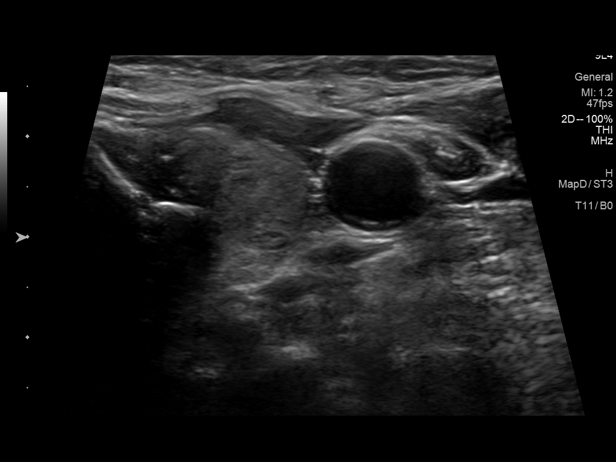
[im 61/78]
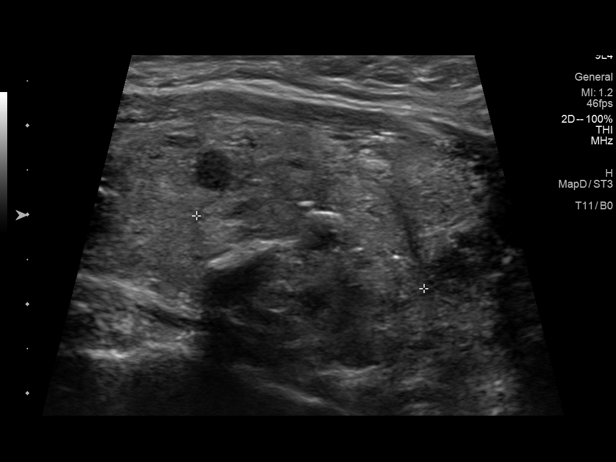
[im 68/78]
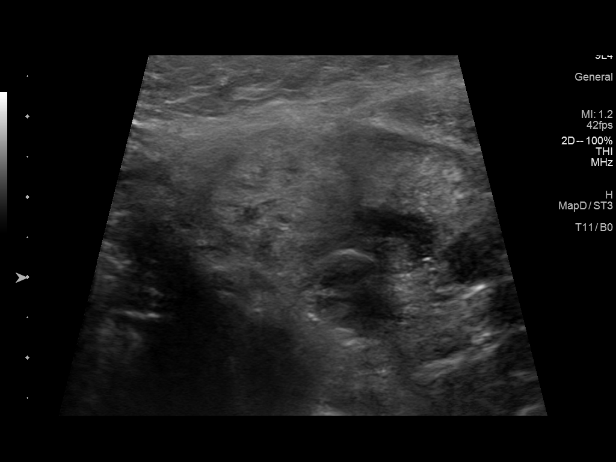
[im 71/78]
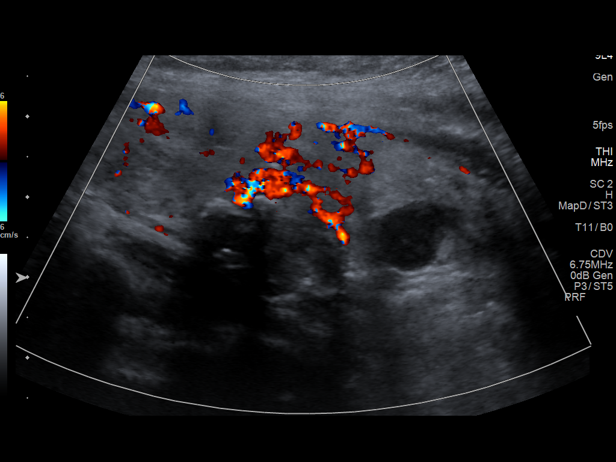
[im 78/78]
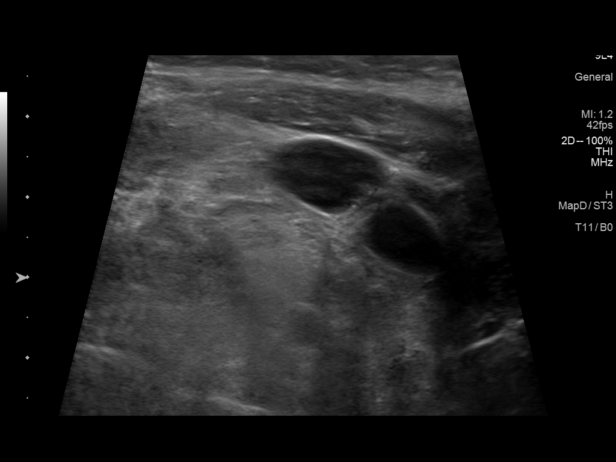

[16 of 25 positions shown; findings below may reference images not displayed]

FINDINGS: Parenchymal Echotexture: Markedly heterogenous

Isthmus: 0.4 cm

Right lobe: 4.8 cm x 2.7 cm x 2.8 cm

Left lobe: 5.8 cm x 2.6 cm x 3.7 cm

_________________________________________________________

Estimated total number of nodules >/= 1 cm: 6-10

Number of spongiform nodules >/=  2 cm not described below (TR1): 0

Number of mixed cystic and solid nodules >/= 1.5 cm not described
below (TR2): 0

_________________________________________________________

Nodule # 1:

Location: Right; Mid

Maximum size: 2.9 cm; Other 2 dimensions: 2.2 cm x 2.1 cm.

Composition: solid/almost completely solid (2)

Echogenicity: isoechoic (1)

Shape: not taller-than-wide (0)

Margins: ill-defined (0)

Echogenic foci: none (0)

ACR TI-RADS total points: 3.

ACR TI-RADS risk category: TR3 (3 points).

ACR TI-RADS recommendations:

Nodule meets criteria for biopsy

_________________________________________________________

Nodule # 2:

Location: Right; Inferior

Maximum size: 1.1 cm; Other 2 dimensions: 1.1 cm x 0.8 cm

Composition: mixed cystic and solid (1)

Echogenicity: isoechoic (1)

Shape: not taller-than-wide (0)

Margins: smooth (0)

Echogenic foci: none (0)

ACR TI-RADS total points: 2.

ACR TI-RADS risk category: TR2 (2 points).

ACR TI-RADS recommendations:

Nodule does not meet criteria for surveillance or biopsy

_________________________________________________________

Nodule # 3:

Location: Right; Inferior

Maximum size: 1.0 cm; Other 2 dimensions: 1.0 cm x 0.6 cm

Composition: cannot determine (2)

Echogenicity: isoechoic (1)

Shape: not taller-than-wide (0)

Margins: ill-defined (0)

Echogenic foci: none (0)

ACR TI-RADS total points: 3.

ACR TI-RADS risk category: TR3 (3 points).

ACR TI-RADS recommendations:

Nodule does not meet criteria for surveillance or biopsy

_________________________________________________________

Nodule # 4:

Location: Left; Superior

Maximum size: 1.3 cm; Other 2 dimensions: 1.0 cm x 1.0 cm

Composition: spongiform (0)

ACR TI-RADS recommendations:

Spongiform nodule does not meet criteria for surveillance or biopsy

_________________________________________________________

Nodule # 5:

Location: Left; Mid

Maximum size: 1.4 cm; Other 2 dimensions: 1.4 cm x 0.9 cm

Composition: solid/almost completely solid (2)

Echogenicity: hypoechoic (2)

Shape: not taller-than-wide (0)

Margins: ill-defined (0)

Echogenic foci: punctate echogenic foci (3)

ACR TI-RADS total points: 7.

ACR TI-RADS risk category: TR5 (>/= 7 points).

ACR TI-RADS recommendations:

Nodule meets criteria for biopsy

_________________________________________________________

Nodule # 6:

Location: Left; Inferior

Maximum size: 2.7 cm; Other 2 dimensions: 2.6 cm x 2.5 cm. Nodule
has decreased in size from the comparison ultrasound survey

Composition: solid/almost completely solid (2)

Echogenicity: hypoechoic (2)

Shape: taller-than-wide (3)

Margins: ill-defined (0)

Echogenic foci: macrocalcifications (1)

ACR TI-RADS total points: 8.

ACR TI-RADS risk category: TR5 (>/= 7 points).

ACR TI-RADS recommendations:

Nodule meets criteria for biopsy

_________________________________________________________

Nodule # 7:

Location: Left; Inferior

Maximum size: 1.7 cm; Other 2 dimensions: 1.5 cm x 1.3 cm

Composition: mixed cystic and solid (1)

Echogenicity: isoechoic (1)

Shape: not taller-than-wide (0)

Margins: ill-defined (0)

Echogenic foci: none (0)

ACR TI-RADS total points: 2.

ACR TI-RADS risk category: TR2 (2 points).

ACR TI-RADS recommendations:

Nodule does not meet criteria for surveillance or biopsy

_________________________________________________________
No adenopathy
IMPRESSION: Multinodular heterogeneous thyroid.

Left inferior thyroid nodule labeled 6 has decreased in size from
the prior ultrasound, previously 3.6 cm, now 2.7 cm, indicating
benign biology. Strictly speaking, the nodule meets criteria for
biopsy, as designated by the newly established ACR TI-RADS criteria.

Left mid thyroid nodule (labeled 5, TR 5, 1.4 cm) meets criteria for
biopsy, as designated by the newly established ACR TI-RADS criteria,
and referral for biopsy is recommended.

Right mid thyroid nodule (labeled 1, TR 3, 2.9 cm) meets criteria
for biopsy, as designated by the newly established ACR TI-RADS
criteria, and referral for biopsy is recommended.

Recommendations follow those established by the new ACR TI-RADS
criteria ([HOSPITAL] 0673;[DATE]).

## 2021-09-22 IMAGING — US US FNA BIOPSY THYROID 1ST LESION
1 series · 13 of 25 positions shown · non-contrast
Comparison: Ultrasound done February 08, 2020

MEDICATIONS:
1% lidocaine 5 mL

COMPLICATIONS:
None immediate.

INDICATION: Indeterminate thyroid nodules

EXAM:
ULTRASOUND GUIDED FINE NEEDLE ASPIRATION OF INDETERMINATE THYROID
NODULES
TECHNIQUE: Informed written consent was obtained from the patient after a
discussion of the risks, benefits and alternatives to treatment.
Questions regarding the procedure were encouraged and answered. A
timeout was performed prior to the initiation of the procedure.

[Series 1: us fna biopsy thyroid 1st lesion · 0.06mm/px · 43 acquisitions, 13 frames shown]
[im 1/43]
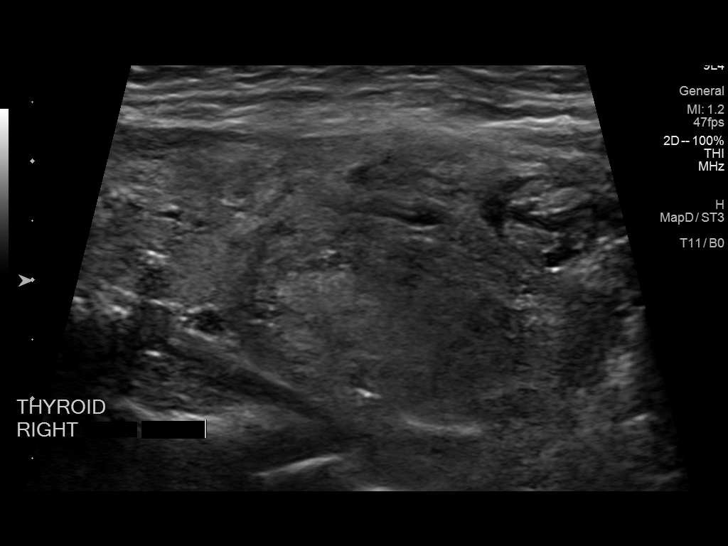
[im 4/43]
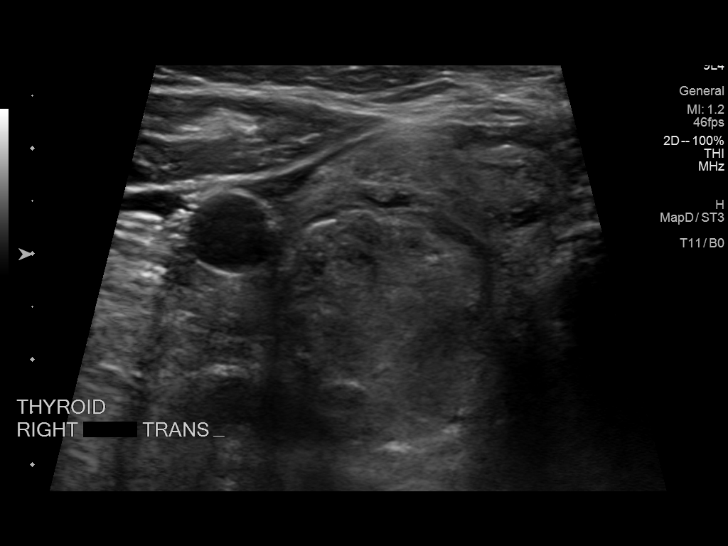
[im 8/43]
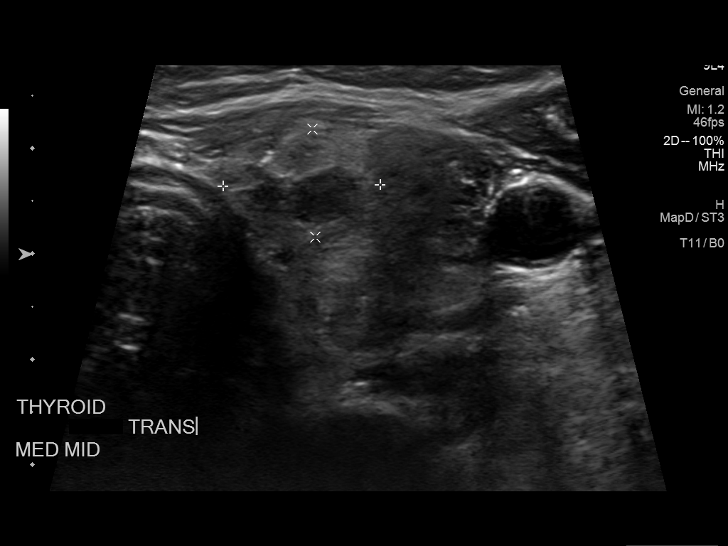
[im 11/43]
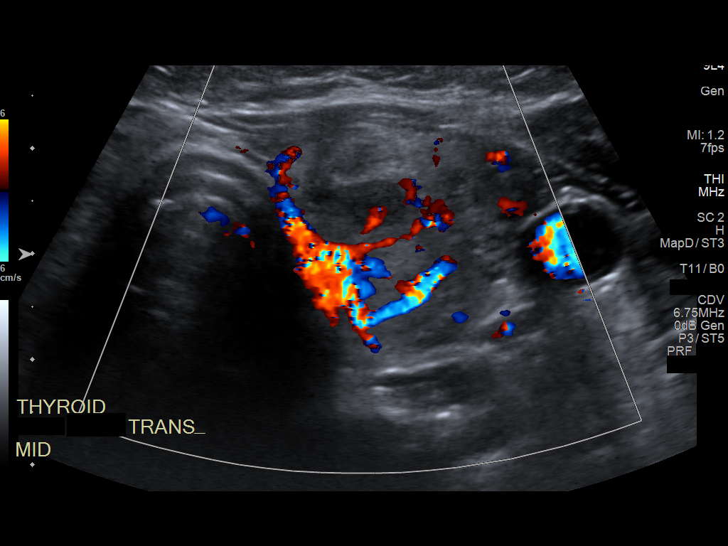
[im 15/43]
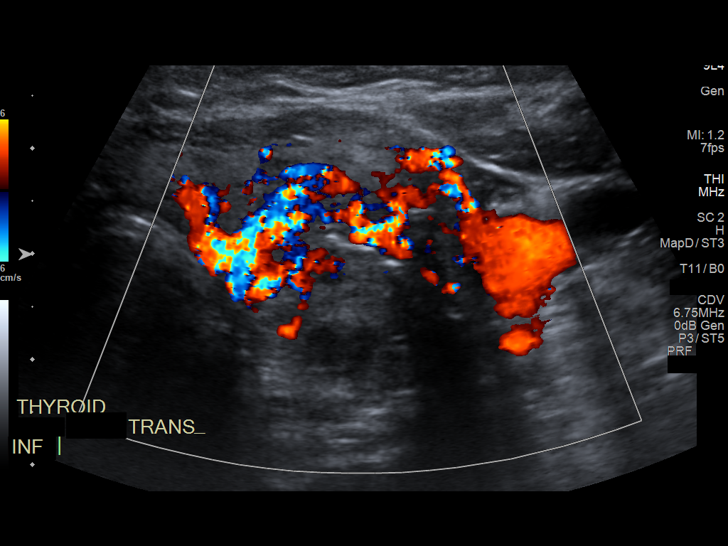
[im 18/43]
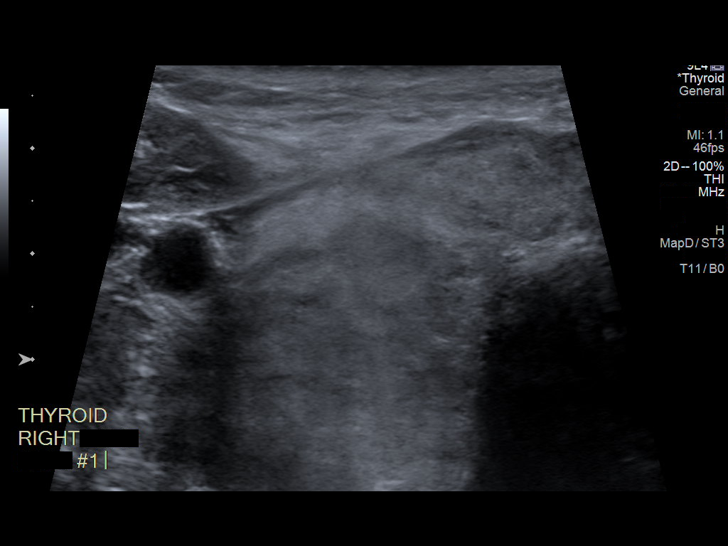
[im 22/43]
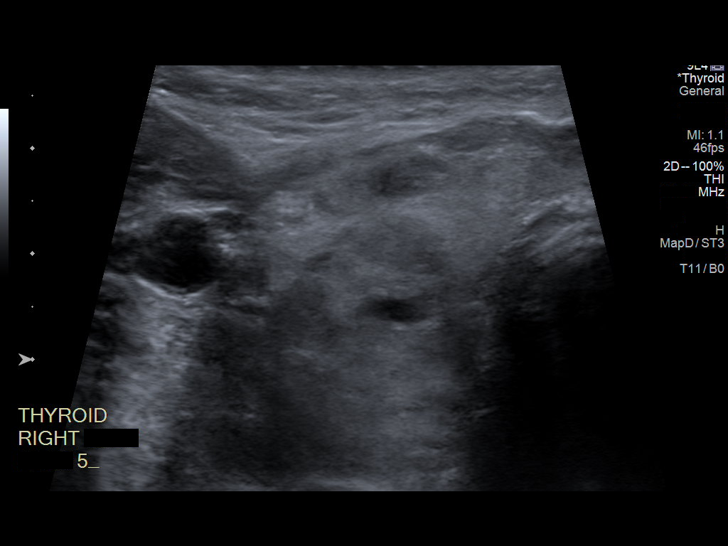
[im 25/43]
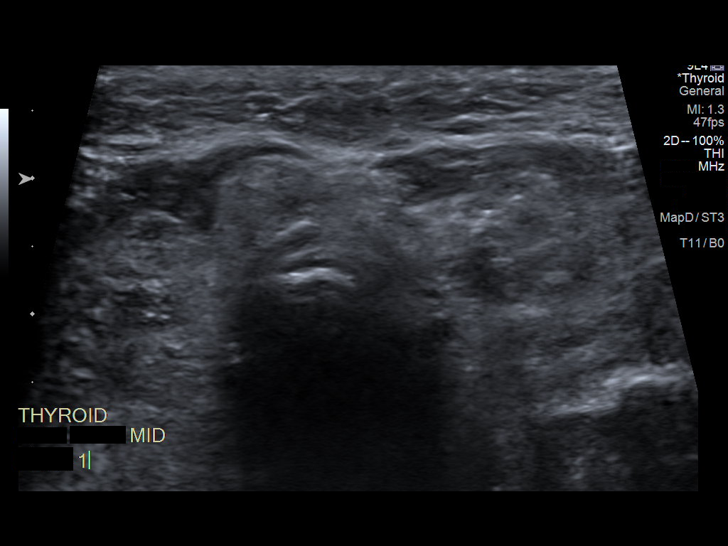
[im 29/43]
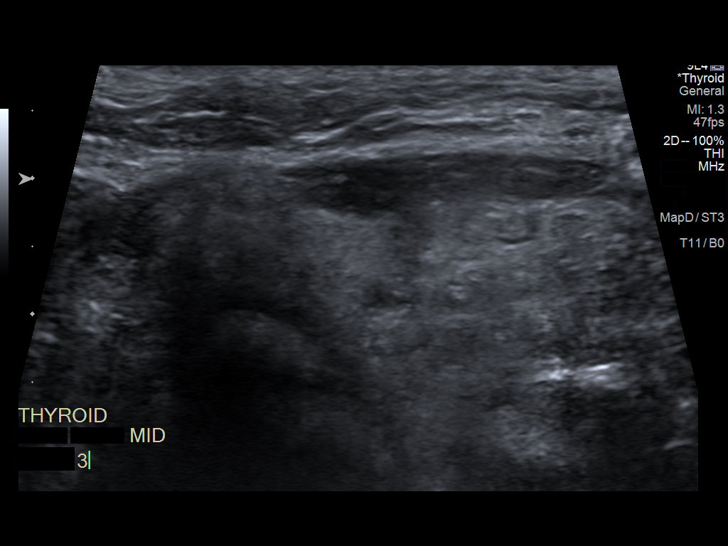
[im 32/43]
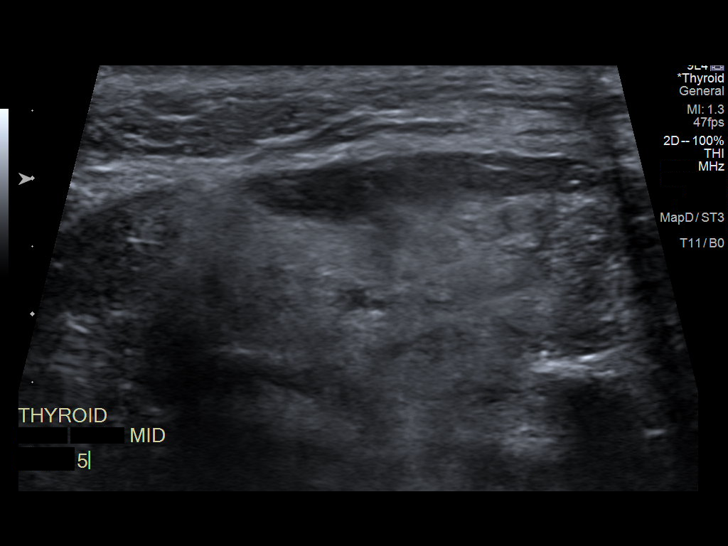
[im 36/43]
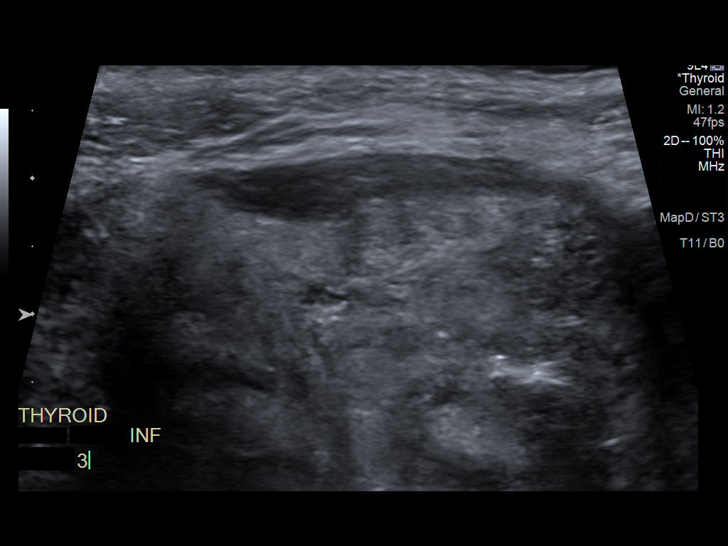
[im 39/43]
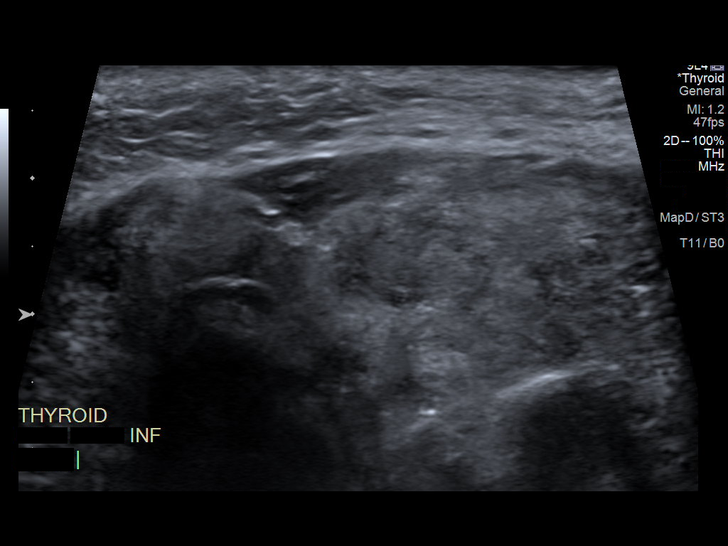
[im 43/43]
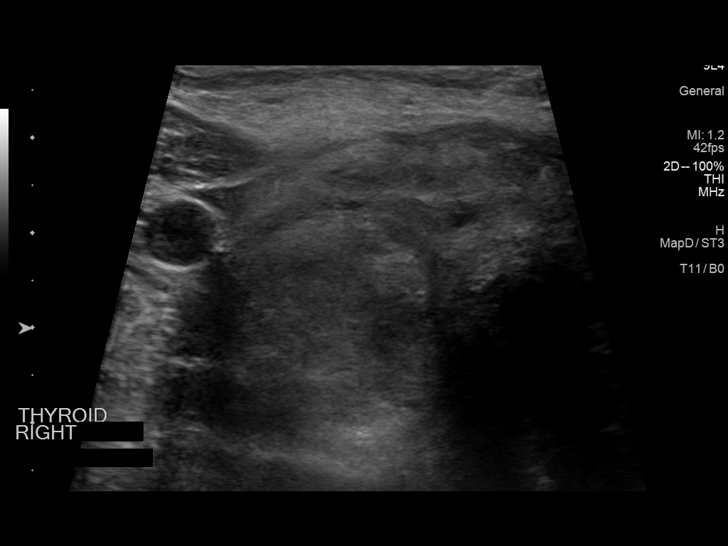

[13 of 25 positions shown; findings below may reference images not displayed]

Pre-procedural ultrasound scanning demonstrated unchanged size and
appearance of the indeterminate nodules within the right left lobes
of the thyroid.

The procedure was planned. The neck was prepped in the usual sterile
fashion, and a sterile drape was applied covering the operative
field. A timeout was performed prior to the initiation of the
procedure. Local anesthesia was provided with 1% lidocaine.

Under direct ultrasound guidance, 5 FNA biopsies were performed of
the right mid thyroid nodule, 5 FNA biopsies were performed of the
left mid thyroid nodule, and 5 FNA biopsies were performed of the
left inferior thyroid nodule with 25 gauge needles.

Multiple ultrasound images were saved for procedural documentation
purposes. The samples were prepared and submitted to pathology.

Limited post procedural scanning was negative for hematoma or
additional complication. Dressings were placed. The patient
tolerated the above procedures procedure well without immediate
postprocedural complication.
FINDINGS: FINDINGS
Nodule reference number based on prior diagnostic ultrasound: 1

Maximum size: 2.9 cm

Location: Right  ;  Mid

ACR TI-RADS risk category:  TR3

Reason for biopsy: meets ACR TI-RADS criteria

Nodule reference number based on prior diagnostic ultrasound: 5

Maximum size: 1.4 cm

Location: Left ;  Mid

ACR TI-RADS risk category:  TR5

Reason for biopsy: meets ACR TI-RADS criteria

Nodule reference number based on prior diagnostic ultrasound: 6

Maximum size: 2.7 cm

Location: Left ;  Inferior

ACR TI-RADS risk category:  TR5

Reason for biopsy: meets ACR TI-RADS criteria

Ultrasound imaging confirms appropriate placement of the needles
within the thyroid nodules.
IMPRESSION: Technically successful ultrasound guided fine needle aspiration of
the right mid thyroid nodule, the left mid thyroid nodule, and the
left inferior thyroid nodule.

## 2023-02-05 NOTE — Progress Notes (Deleted)
Cardiology Office Note:   Date:  02/05/2023  NAME:  Linda Hawkins    MRN: [id number] DOB:  May 31, 1961   PCP:  Farris Has, MD  Cardiologist:  None  Electrophysiologist:  None   Referring MD: Farris Has, MD   No chief complaint on file.   History of Present Illness:   Linda Hawkins is a 62 y.o. female with a hx of HTN who is being seen today for the evaluation of chest pain at the request of Farris Has, MD.  TSH 1.3  Past Medical History: Past Medical History:  Diagnosis Date   Arthritis    Cancer [facility])    History of uterine cancer    Dr Pippet   Hypertension    Obesity    Seasonal allergies     Past Surgical History: Past Surgical History:  Procedure Laterality Date   BIOPSY THYROID  06/2011   JOINT REPLACEMENT  02/2011   left knee    TOTAL ABDOMINAL HYSTERECTOMY  11/2008   TOTAL KNEE ARTHROPLASTY  2012    Current Medications: No outpatient medications have been marked as taking for the 02/08/23 encounter (Appointment) with Sande Rives, MD.     Allergies:    Lisinopril   Social History: Social History   Socioeconomic History   Marital status: Married    Spouse name: Not on file   Number of children: Not on file   Years of education: Not on file   Highest education level: Not on file  Occupational History   Not on file  Tobacco Use   Smoking status: Never   Smokeless tobacco: Never  Substance and Sexual Activity   Alcohol use: No   Drug use: No   Sexual activity: Not on file  Other Topics Concern   Not on file  Social History Narrative   Not on file   Social Determinants of Health   Financial Resource Strain: Not on file  Food Insecurity: Not on file  Transportation Needs: Not on file  Physical Activity: Not on file  Stress: Not on file  Social Connections: Not on file     Family History: The patient's family history includes Cancer in her maternal uncle and sister; Diabetes in her father; Heart disease in her  father; Hypertension in her father and mother; Lung disease in her father; Pancreatic cancer in her sister. There is no history of Breast cancer.  ROS:   All other ROS reviewed and negative. Pertinent positives noted in the HPI.     EKGs/Labs/Other Studies Reviewed:   The following studies were personally reviewed by me today:  EKG:  EKG is *** ordered today.        Recent Labs: No results found for requested labs within last 365 days.   Recent Lipid Panel No results found for: "CHOL", "TRIG", "HDL", "CHOLHDL", "VLDL", "LDLCALC", "LDLDIRECT"  Physical Exam:   VS:  There were no vitals taken for this visit.   Wt Readings from Last 3 Encounters:  09/09/11 275 lb 12.8 oz (125.1 kg)  07/02/11 271 lb 2 oz (123 kg)    General: Well nourished, well developed, in no acute distress Head: Atraumatic, normal size  Eyes: PEERLA, EOMI  Neck: Supple, no JVD Endocrine: No thryomegaly Cardiac: Normal S1, S2; RRR; no murmurs, rubs, or gallops Lungs: Clear to auscultation bilaterally, no wheezing, rhonchi or rales  Abd: Soft, nontender, no hepatomegaly  Ext: No edema, pulses 2+ Musculoskeletal: No deformities, BUE and BLE strength normal and equal  Skin: Warm and dry, no rashes   Neuro: Alert and oriented to person, place, time, and situation, CNII-XII grossly intact, no focal deficits  Psych: Normal mood and affect   ASSESSMENT:   Linda Hawkins is a 62 y.o. female who presents for the following: No diagnosis found.  PLAN:   There are no diagnoses linked to this encounter.  {Are you ordering a CV Procedure (e.g. stress test, cath, DCCV, TEE, etc)?   Press F2        :[id number]}  Disposition: No follow-ups on file.  Medication Adjustments/Labs and Tests Ordered: Current medicines are reviewed at length with the patient today.  Concerns regarding medicines are outlined above.  No orders of the defined types were placed in this encounter.  No orders of the defined types were placed  in this encounter.  There are no Patient Instructions on file for this visit.   Time Spent with Patient: I have spent a total of *** minutes with patient reviewing hospital notes, telemetry, EKGs, labs and examining the patient as well as establishing an assessment and plan that was discussed with the patient.  > 50% of time was spent in direct patient care.  Signed, Lenna Gilford. Flora Lipps, MD, [facility]  [facility]  [address], [address] [place], [place] [address] [phone]  02/05/2023 9:34 PM

## 2023-02-08 ENCOUNTER — Ambulatory Visit: Payer: BC Managed Care – PPO | Admitting: Cardiovascular Disease

## 2023-02-08 DIAGNOSIS — R072 Precordial pain: Secondary | ICD-10-CM

## 2023-04-07 ENCOUNTER — Other Ambulatory Visit (HOSPITAL_COMMUNITY): Payer: Self-pay | Admitting: Family Medicine

## 2023-04-07 DIAGNOSIS — E785 Hyperlipidemia, unspecified: Secondary | ICD-10-CM

## 2023-04-14 ENCOUNTER — Ambulatory Visit (HOSPITAL_COMMUNITY)
Admission: RE | Admit: 2023-04-14 | Discharge: 2023-04-14 | Disposition: A | Discharge status: Home / Self Care | Payer: BC Managed Care – PPO | Source: Ambulatory Visit | Attending: Family Medicine | Admitting: Family Medicine

## 2023-04-14 DIAGNOSIS — E785 Hyperlipidemia, unspecified: Secondary | ICD-10-CM | POA: Insufficient documentation

## 2023-05-16 NOTE — Progress Notes (Unsigned)
Cardiology Office Note:  .   Date:  05/17/2023  ID:  Linda Hawkins, DOB 12-11-1960, MRN [id number] PCP: Farris Has, MD  [facility] Providers Cardiologist:  Reatha Harps, MD { History of Present Illness: Marland Kitchen   Linda Hawkins is a 62 y.o. female with history of HTN who presents for the evaluation of chest pain at the request of Farris Has, MD.  Discussed the use of AI scribe software for clinical note transcription with the patient, who gave verbal consent to proceed.  History of Present Illness   Linda Hawkins, a 62 year old female with a history of hypertension and uterine cancer, presents for evaluation of chest pain. She initially experienced heart racing, dizziness, and chest twinges, which she attributed to her medication due to recent significant weight loss. However, these symptoms have since resolved. The last episode was approximately in July. She reports occasional heart flutters, but no more racing heart or dizziness. She had a knee replacement last year and was taking melatonin for sleep issues, which she has since stopped. She believes the cessation of melatonin may have contributed to the resolution of her symptoms. She has a family history of heart disease, with both parents having heart issues in their sixties. She does not smoke, drink alcohol, or use drugs. She is active, engaging in low-impact exercises and cycling several times a week.       T chol 233, HDL 89, LDL 123, TG 44    Problem List HTN Obesity  -BMI 41 3. Coronary calcium  -CAC 1.0 (57th percentile)    ROS: All other ROS reviewed and negative. Pertinent positives noted in the HPI.     Studies Reviewed: Marland Kitchen   EKG Interpretation Date/Time:  Tuesday May 17 2023 09:12:04 EDT Ventricular Rate:  68 PR Interval:  168 QRS Duration:  92 QT Interval:  404 QTC Calculation: 429 R Axis:   -14  Text Interpretation: Normal sinus rhythm Normal ECG Confirmed by Lennie Odor [phone])  on 05/17/2023 9:33:23 AM   Physical Exam:   VS:  BP 128/74 (BP Location: Left Arm, Patient Position: Sitting, Cuff Size: Normal)   Pulse 69   Ht 5\' 4"  (1.626 m)   Wt 237 lb (107.5 kg)   SpO2 97%   BMI 40.68 kg/m    Wt Readings from Last 3 Encounters:  05/17/23 237 lb (107.5 kg)  09/09/11 275 lb 12.8 oz (125.1 kg)  07/02/11 271 lb 2 oz (123 kg)    GEN: Well nourished, well developed in no acute distress NECK: No JVD; No carotid bruits CARDIAC: RRR, no murmurs, rubs, gallops RESPIRATORY:  Clear to auscultation without rales, wheezing or rhonchi  ABDOMEN: Soft, non-tender, non-distended EXTREMITIES:  No edema; No deformity  ASSESSMENT AND PLAN: .   Assessment and Plan    Palpitations Chest pain Dizziness  Resolved episodes of heart racing and chest twinges. Last episode in July. Possible association with melatonin use which has been discontinued. -No further cardiac testing indicated at this time. -Return if symptoms recur for further evaluation.  Hyperlipidemia Family history of heart disease  LDL 123, HDL 89. Patient reluctant to start statin therapy. Coronary calcium score 1 (50th percentile). Family history of heart disease. -Order LP(a) for further risk stratification. -Discuss statin therapy if LP(a) is elevated.              Follow-up: Return if symptoms worsen or fail to improve.   Signed, Linda Hawkins. Linda Lipps, MD, [facility]  [facility]  [address], [address] [place], [place] [address] [phone]  9:55 AM

## 2023-05-17 ENCOUNTER — Encounter: Payer: Self-pay | Admitting: Cardiovascular Disease

## 2023-05-17 ENCOUNTER — Ambulatory Visit: Payer: BC Managed Care – PPO | Attending: Cardiovascular Disease | Admitting: Cardiovascular Disease

## 2023-05-17 VITALS — BP 128/74 | HR 69 | Ht 64.0 in | Wt 237.0 lb

## 2023-05-17 DIAGNOSIS — Z8249 Family history of ischemic heart disease and other diseases of the circulatory system: Secondary | ICD-10-CM | POA: Diagnosis not present

## 2023-05-17 DIAGNOSIS — R002 Palpitations: Secondary | ICD-10-CM

## 2023-05-17 DIAGNOSIS — R072 Precordial pain: Secondary | ICD-10-CM | POA: Diagnosis not present

## 2023-05-17 DIAGNOSIS — R42 Dizziness and giddiness: Secondary | ICD-10-CM

## 2023-05-17 NOTE — Patient Instructions (Signed)
Medication Instructions:  None   *If you need a refill on your cardiac medications before your next appointment, please call your pharmacy*   Lab Work:  Lpa  If you have labs (blood work) drawn today and your tests are completely normal, you will receive your results only by: MyChart Message (if you have MyChart) OR A paper copy in the mail If you have any lab test that is abnormal or we need to change your treatment, we will call you to review the results.   Testing/Procedures:  None   Follow-Up: At [facility], you and your health needs are our priority.  As part of our continuing mission to provide you with exceptional heart care, we have created designated Provider Care Teams.  These Care Teams include your primary Cardiologist (physician) and Advanced Practice Providers (APPs -  Physician Assistants and Nurse Practitioners) who all work together to provide you with the care you need, when you need it.  We recommend signing up for the patient portal called "MyChart".  Sign up information is provided on this After Visit Summary.  MyChart is used to connect with patients for Virtual Visits (Telemedicine).  Patients are able to view lab/test results, encounter notes, upcoming appointments, etc.  Non-urgent messages can be sent to your provider as well.   To learn more about what you can do with MyChart, go to [email or web address].    Your next appointment:   Follow up as needed   The format for your next appointment:   In Person  Provider:   Reatha Harps, MD    Other Instructions

## 2023-05-18 LAB — LIPOPROTEIN A (LPA): Lipoprotein (a): 103.8 nmol/L — ABNORMAL HIGH (ref <75.0)

## 2023-05-22 ENCOUNTER — Encounter: Payer: Self-pay | Admitting: Cardiovascular Disease

## 2023-05-22 DIAGNOSIS — E785 Hyperlipidemia, unspecified: Secondary | ICD-10-CM

## 2023-05-22 DIAGNOSIS — Z8249 Family history of ischemic heart disease and other diseases of the circulatory system: Secondary | ICD-10-CM

## 2023-05-27 MED ORDER — EZETIMIBE 10 MG PO TABS: 10.0000 mg | ORAL_TABLET | Freq: Every day | ORAL | 3 refills | Status: DC

## 2023-05-27 NOTE — Telephone Encounter (Signed)
  Let's call in  zetia 10 mg daily. Needs to see an APP in 2 months for recheck lipids.  Gerri Spore T. Flora Lipps, MD, [facility]  [facility] [address], [address] [place], [place] [address] [phone] 1:33 PM

## 2023-08-11 ENCOUNTER — Ambulatory Visit: Payer: BC Managed Care – PPO | Admitting: Student

## 2023-08-13 NOTE — Progress Notes (Signed)
 Cardiology Office Note:    Date:  08/23/2023   ID:  Linda Hawkins, DOB [birth year], MRN [id number]  PCP:  Kip Righter, MD  Cardiologist:  Darryle ONEIDA Decent, MD     Referring MD: Kip Righter, MD   Chief Complaint: follow-up of chest pain  History of Present Illness:    Linda Hawkins is a 63 y.o. female with a history of mild coronary artery calcifications with a coronary calcium score of 1.07 in 03/2023, hypertension, hyperlipidemia, uterine cancer, and obesity who is followed by Dr. Decent and presents today for follow-up of chest pain.  Patient was referred to Dr. Decent in 04/2023 for further evaluation of chest pain and palpitations. She was having these symptoms several months prior to this visit but they had resolved. Possible association with Melatonin use which she had already discontinued. A prior coronary calcium score in 03/2023 was 1.07 (57th percentile for age and sex). Given no recurrent symptoms, no cardiac work was recommended at that time. Lipoprotein (a) was ordered for for further risk stratification and to help guide statin therapy. This came back elevated at 103.8. She had concerns about starting a statin so was started on Zetia  in stead.   She presents today for follow-up. She has done well since last visit. No chest pain or shortness of breath. She describes being aware of her heart beat when she is laying down or exercising but no real palpitations. She denies any irregular heart beats or significant tachycardia. No orthopnea, PND, edema, lightheadedness, dizziness, or syncope.   EKGs/Labs/Other Studies Reviewed:    The following studies were reviewed:  CT Cardiac Scoring 04/14/2023: Impressions: Coronary calcium score of 1.07. This was 57th percentile for age-, race-, and sex-matched controls.  EKG:  EKG ordered today.   EKG Interpretation Date/Time:  Tuesday August 23 2023 08:06:39 EST Ventricular Rate:  58 PR Interval:  170 QRS  Duration:  88 QT Interval:  410 QTC Calculation: 402 R Axis:   45  Text Interpretation: Sinus bradycardia No significant change since last tracing Confirmed by Sharonna Vinje [phone]) on 08/23/2023 8:11:15 AM    Recent Labs: No results found for requested labs within last 365 days.  Recent Lipid Panel No results found for: CHOL, TRIG, HDL, CHOLHDL, VLDL, LDLCALC, LDLDIRECT  Physical Exam:    Vital Signs: BP 110/82   Pulse (!) 58   Ht 5' 4 (1.626 m)   Wt 247 lb (112 kg)   SpO2 98%   BMI 42.40 kg/m     Wt Readings from Last 3 Encounters:  08/23/23 247 lb (112 kg)  05/17/23 237 lb (107.5 kg)  09/09/11 275 lb 12.8 oz (125.1 kg)     General: 63 y.o. Caucasian female in no acute distress. HEENT: Normocephalic and atraumatic. Sclera clear.  Neck: Supple. No carotid bruits. No JVD. Heart: Borderline bradycardia with normal rate.  No murmurs, gallops, or rubs.  Lungs: No increased work of breathing. Clear to ausculation bilaterally. No wheezes, rhonchi, or rales.  Extremities: No lower extremity edema.  Skin: Warm and dry. Neuro: No focal deficits. Psych: Normal affect. Responds appropriately.   Assessment:    1. Coronary artery calcification   2. Primary hypertension   3. Hyperlipidemia LDL goal <70     Plan:    Mild Coronary Artery Calcifications  Coronary calcium score in 03/2023 was 1.07 (57th percentile for age and sex).  - No chest pain or anginal equivalents.  - She has declined statins in the past.  Continue Zetia  10mg  daily.  Hypertension BP well controlled.  - Continue Valsartan-HCTZ 160-25mg  daily. - She states her PCP monitors her renal function/ electrolytes.   Hyperlipidemia Lipid panel in 03/2023: Total Cholesterol 233, Triglycerides 44, HDL 89, LDL 137. Lipoprotein (a) 103.8 in 04/2023. LDL goal <70 given CAD. - Statin was previously recommended but patient declined due to concerns for myalgias. Therefore, she was started on Zetia  10mg   daily instead. Continue.  - Will repeat lipid panel and LFTs.  - Of note, she needs refill of Zetia  but will wait for labs today in case any adjustments need to be made.  Disposition: Follow up in 1 year.   Signed, Lydiann Bonifas E Angela Platner, PA-C  08/23/2023 8:22 AM    [facility]

## 2023-08-23 ENCOUNTER — Encounter: Payer: Self-pay | Admitting: Student

## 2023-08-23 ENCOUNTER — Ambulatory Visit: Payer: BC Managed Care – PPO | Attending: Student | Admitting: Student

## 2023-08-23 VITALS — BP 110/82 | HR 58 | Ht 64.0 in | Wt 247.0 lb

## 2023-08-23 DIAGNOSIS — Z8249 Family history of ischemic heart disease and other diseases of the circulatory system: Secondary | ICD-10-CM

## 2023-08-23 DIAGNOSIS — I10 Essential (primary) hypertension: Secondary | ICD-10-CM | POA: Diagnosis not present

## 2023-08-23 DIAGNOSIS — I2510 Atherosclerotic heart disease of native coronary artery without angina pectoris: Secondary | ICD-10-CM | POA: Diagnosis not present

## 2023-08-23 DIAGNOSIS — E785 Hyperlipidemia, unspecified: Secondary | ICD-10-CM | POA: Diagnosis not present

## 2023-08-23 LAB — HEPATIC FUNCTION PANEL
ALT: 15 [IU]/L (ref 0–32)
AST: 17 [IU]/L (ref 0–40)
Albumin: 4.1 g/dL (ref 3.9–4.9)
Alkaline Phosphatase: 64 [IU]/L (ref 44–121)
Bilirubin Total: 0.3 mg/dL (ref 0.0–1.2)
Bilirubin, Direct: 0.13 mg/dL (ref 0.00–0.40)
Total Protein: 6.5 g/dL (ref 6.0–8.5)

## 2023-08-23 LAB — LIPID PANEL
Chol/HDL Ratio: 2.3 {ratio} (ref 0.0–4.4)
Cholesterol, Total: 185 mg/dL (ref 100–199)
HDL: 80 mg/dL (ref >39)
LDL Chol Calc (NIH): 92 mg/dL (ref 0–99)
Triglycerides: 69 mg/dL (ref 0–149)
VLDL Cholesterol Cal: 13 mg/dL (ref 5–40)

## 2023-08-23 NOTE — Patient Instructions (Signed)
Medication Instructions:  The current medical regimen is effective;  continue present plan and medications as directed. Please refer to the Current Medication list given to you today.  *If you need a refill on your cardiac medications before your next appointment, please call your pharmacy*  Lab Work: LIPIF AND LFT If you have labs (blood work) drawn today and your tests are completely normal, you will receive your results only by:  MyChart Message (if you have MyChart) OR  A paper copy in the mail If you have any lab test that is abnormal or we need to change your treatment, we will call you to review the results.  Testing/Procedures: NONE  Follow-Up: At [facility], you and your health needs are our priority.  As part of our continuing mission to provide you with exceptional heart care, we have created designated Provider Care Teams.  These Care Teams include your primary Cardiologist (physician) and Advanced Practice Providers (APPs -  Physician Assistants and Nurse Practitioners) who all work together to provide you with the care you need, when you need it.  Your next appointment:   12 month(s)  Provider:   Reatha Harps, MD  or Marjie Skiff, PA-C

## 2023-08-24 ENCOUNTER — Encounter: Payer: Self-pay | Admitting: Cardiovascular Disease

## 2023-08-28 MED ORDER — EZETIMIBE 10 MG PO TABS: 10.0000 mg | ORAL_TABLET | Freq: Every day | ORAL | 3 refills | Status: AC

## 2023-08-28 NOTE — Addendum Note (Signed)
 Addended by: Rechelle Niebla on: 08/28/2023 03:35 PM   Modules accepted: Orders

## 2024-05-10 ENCOUNTER — Telehealth: Admitting: Physician Assistant

## 2024-05-10 DIAGNOSIS — J329 Chronic sinusitis, unspecified: Secondary | ICD-10-CM | POA: Diagnosis not present

## 2024-05-10 DIAGNOSIS — B9689 Other specified bacterial agents as the cause of diseases classified elsewhere: Secondary | ICD-10-CM

## 2024-05-10 MED ORDER — FLUTICASONE PROPIONATE 50 MCG/ACT NA SUSP: 2.0000 | Freq: Every day | NASAL | 0 refills | Status: AC

## 2024-05-10 MED ORDER — AMOXICILLIN-POT CLAVULANATE 875-125 MG PO TABS: 1.0000 | ORAL_TABLET | Freq: Two times a day (BID) | ORAL | 0 refills | Status: DC

## 2024-05-10 NOTE — Progress Notes (Signed)
 E-Visit for Sinus Problems  We are sorry that you are not feeling well.  Here is how we plan to help!  Based on what you have shared with me it looks like you have sinusitis.  Sinusitis is inflammation and infection in the sinus cavities of the head.  Based on your presentation I believe you most likely have Acute Bacterial Sinusitis.  This is an infection caused by bacteria and is treated with antibiotics. I have prescribed Augmentin  875mg /125mg  one tablet twice daily with food, for 7 days. and I have also prescribed Flonase  Nasal Spray Use 2 sprays in each nostril daily for 10-14 days You may use an oral decongestant such as Mucinex  D or if you have glaucoma or high blood pressure use plain Mucinex . Saline nasal spray help and can safely be used as often as needed for congestion.  If you develop worsening sinus pain, fever or notice severe headache and vision changes, or if symptoms are not better after completion of antibiotic, please schedule an appointment with a health care provider.    Sinus infections are not as easily transmitted as other respiratory infection, however we still recommend that you avoid close contact with loved ones, especially the very young and elderly.  Remember to wash your hands thoroughly throughout the day as this is the number one way to prevent the spread of infection!  Home Care: Only take medications as instructed by your medical team. Complete the entire course of an antibiotic. Do not take these medications with alcohol. A steam or ultrasonic humidifier can help congestion.  You can place a towel over your head and breathe in the steam from hot water coming from a faucet. Avoid close contacts especially the very young and the elderly. Cover your mouth when you cough or sneeze. Always remember to wash your hands.  Get Help Right Away If: You develop worsening fever or sinus pain. You develop a severe head ache or visual changes. Your symptoms persist after  you have completed your treatment plan.  Make sure you Understand these instructions. Will watch your condition. Will get help right away if you are not doing well or get worse.  Your e-visit answers were reviewed by a board certified advanced clinical practitioner to complete your personal care plan.  Depending on the condition, your plan could have included both over the counter or prescription medications.  If there is a problem please reply  once you have received a response from your provider.  Your safety is important to us .  If you have drug allergies check your prescription carefully.    You can use MyChart to ask questions about today's visit, request a non-urgent call back, or ask for a work or school excuse for 24 hours related to this e-Visit. If it has been greater than 24 hours you will need to follow up with your provider, or enter a new e-Visit to address those concerns.  You will get an e-mail in the next two days asking about your experience.  I hope that your e-visit has been valuable and will speed your recovery. Thank you for using e-visits.  I have spent 5 minutes in review of e-visit questionnaire, review and updating patient chart, medical decision making and response to patient.   Linda CHRISTELLA Dickinson, PA-C

## 2024-08-01 ENCOUNTER — Telehealth: Admitting: Family Medicine

## 2024-08-01 DIAGNOSIS — B9689 Other specified bacterial agents as the cause of diseases classified elsewhere: Secondary | ICD-10-CM | POA: Diagnosis not present

## 2024-08-01 DIAGNOSIS — J019 Acute sinusitis, unspecified: Secondary | ICD-10-CM

## 2024-08-01 MED ORDER — AMOXICILLIN-POT CLAVULANATE 875-125 MG PO TABS: 1.0000 | ORAL_TABLET | Freq: Two times a day (BID) | ORAL | 0 refills | Status: AC

## 2024-08-01 NOTE — Progress Notes (Signed)
